# Patient Record
Sex: Female | Born: 1970 | Race: Black or African American | Hispanic: No | Marital: Single | Smoking: Never smoker
Health system: Southern US, Community
[De-identification: ages and names within clinical notes are randomized; demographics above are authoritative.]

## PROBLEM LIST (undated history)

## (undated) DIAGNOSIS — I10 Essential (primary) hypertension: Secondary | ICD-10-CM

---

## 1999-12-10 ENCOUNTER — Encounter: Payer: Self-pay | Admitting: Emergency Medicine

## 1999-12-10 ENCOUNTER — Emergency Department (HOSPITAL_COMMUNITY): Admission: EM | Admit: 1999-12-10 | Discharge: 1999-12-10 | Payer: Self-pay | Admitting: Emergency Medicine

## 2000-11-05 ENCOUNTER — Emergency Department (HOSPITAL_COMMUNITY): Admission: EM | Admit: 2000-11-05 | Discharge: 2000-11-05 | Payer: Self-pay | Admitting: Emergency Medicine

## 2000-11-05 ENCOUNTER — Encounter: Payer: Self-pay | Admitting: Emergency Medicine

## 2002-09-26 ENCOUNTER — Emergency Department (HOSPITAL_COMMUNITY): Admission: EM | Admit: 2002-09-26 | Discharge: 2002-09-26 | Payer: Self-pay | Admitting: Emergency Medicine

## 2004-03-08 ENCOUNTER — Ambulatory Visit: Payer: Self-pay | Admitting: Family Medicine

## 2004-07-04 ENCOUNTER — Ambulatory Visit: Payer: Self-pay | Admitting: *Deleted

## 2004-07-04 ENCOUNTER — Ambulatory Visit: Payer: Self-pay | Admitting: Family Medicine

## 2004-08-17 ENCOUNTER — Ambulatory Visit: Payer: Self-pay | Admitting: Family Medicine

## 2004-09-07 ENCOUNTER — Ambulatory Visit: Payer: Self-pay | Admitting: Family Medicine

## 2004-09-21 ENCOUNTER — Ambulatory Visit: Payer: Self-pay | Admitting: Family Medicine

## 2004-09-26 ENCOUNTER — Ambulatory Visit: Payer: Self-pay | Admitting: *Deleted

## 2004-09-26 ENCOUNTER — Ambulatory Visit (HOSPITAL_COMMUNITY): Admission: RE | Admit: 2004-09-26 | Discharge: 2004-09-26 | Payer: Self-pay | Admitting: *Deleted

## 2004-10-19 ENCOUNTER — Ambulatory Visit: Payer: Self-pay | Admitting: *Deleted

## 2004-11-02 ENCOUNTER — Ambulatory Visit: Payer: Self-pay | Admitting: Family Medicine

## 2004-11-16 ENCOUNTER — Ambulatory Visit: Payer: Self-pay | Admitting: Family Medicine

## 2004-11-30 ENCOUNTER — Ambulatory Visit (HOSPITAL_COMMUNITY): Admission: RE | Admit: 2004-11-30 | Discharge: 2004-11-30 | Payer: Self-pay | Admitting: *Deleted

## 2004-11-30 ENCOUNTER — Ambulatory Visit: Payer: Self-pay | Admitting: Family Medicine

## 2004-12-14 ENCOUNTER — Ambulatory Visit: Payer: Self-pay | Admitting: Family Medicine

## 2004-12-28 ENCOUNTER — Ambulatory Visit: Payer: Self-pay | Admitting: Family Medicine

## 2005-01-03 ENCOUNTER — Ambulatory Visit: Payer: Self-pay | Admitting: Obstetrics & Gynecology

## 2005-01-05 ENCOUNTER — Ambulatory Visit (HOSPITAL_COMMUNITY): Admission: RE | Admit: 2005-01-05 | Discharge: 2005-01-05 | Payer: Self-pay | Admitting: *Deleted

## 2005-01-11 ENCOUNTER — Ambulatory Visit: Payer: Self-pay | Admitting: Family Medicine

## 2005-01-18 ENCOUNTER — Ambulatory Visit: Payer: Self-pay | Admitting: Family Medicine

## 2005-01-22 ENCOUNTER — Ambulatory Visit: Payer: Self-pay | Admitting: Obstetrics & Gynecology

## 2005-01-22 ENCOUNTER — Ambulatory Visit (HOSPITAL_COMMUNITY): Admission: RE | Admit: 2005-01-22 | Discharge: 2005-01-22 | Payer: Self-pay | Admitting: *Deleted

## 2005-01-25 ENCOUNTER — Ambulatory Visit: Payer: Self-pay | Admitting: Family Medicine

## 2005-01-29 ENCOUNTER — Ambulatory Visit (HOSPITAL_COMMUNITY): Admission: RE | Admit: 2005-01-29 | Discharge: 2005-01-29 | Payer: Self-pay | Admitting: *Deleted

## 2005-01-29 ENCOUNTER — Ambulatory Visit: Payer: Self-pay | Admitting: Obstetrics & Gynecology

## 2005-01-31 ENCOUNTER — Ambulatory Visit: Payer: Self-pay | Admitting: Obstetrics & Gynecology

## 2005-02-05 ENCOUNTER — Ambulatory Visit: Payer: Self-pay | Admitting: Obstetrics and Gynecology

## 2005-02-05 ENCOUNTER — Ambulatory Visit (HOSPITAL_COMMUNITY): Admission: RE | Admit: 2005-02-05 | Discharge: 2005-02-05 | Payer: Self-pay | Admitting: *Deleted

## 2005-02-08 ENCOUNTER — Ambulatory Visit: Payer: Self-pay | Admitting: Family Medicine

## 2005-02-13 ENCOUNTER — Ambulatory Visit (HOSPITAL_COMMUNITY): Admission: RE | Admit: 2005-02-13 | Discharge: 2005-02-13 | Payer: Self-pay | Admitting: *Deleted

## 2005-02-13 ENCOUNTER — Ambulatory Visit: Payer: Self-pay | Admitting: *Deleted

## 2005-02-15 ENCOUNTER — Ambulatory Visit: Payer: Self-pay | Admitting: Family Medicine

## 2005-02-17 ENCOUNTER — Inpatient Hospital Stay (HOSPITAL_COMMUNITY): Admission: AD | Admit: 2005-02-17 | Discharge: 2005-02-25 | Payer: Self-pay

## 2005-02-17 ENCOUNTER — Ambulatory Visit: Payer: Self-pay | Admitting: Obstetrics and Gynecology

## 2005-02-21 ENCOUNTER — Encounter (INDEPENDENT_AMBULATORY_CARE_PROVIDER_SITE_OTHER): Payer: Self-pay | Admitting: Specialist

## 2005-02-28 ENCOUNTER — Ambulatory Visit: Payer: Self-pay | Admitting: *Deleted

## 2005-04-27 ENCOUNTER — Inpatient Hospital Stay (HOSPITAL_COMMUNITY): Admission: AD | Admit: 2005-04-27 | Discharge: 2005-04-27 | Payer: Self-pay | Admitting: Obstetrics and Gynecology

## 2005-04-27 ENCOUNTER — Ambulatory Visit: Payer: Self-pay | Admitting: Obstetrics and Gynecology

## 2005-05-04 ENCOUNTER — Emergency Department (HOSPITAL_COMMUNITY): Admission: EM | Admit: 2005-05-04 | Discharge: 2005-05-04 | Payer: Self-pay | Admitting: Emergency Medicine

## 2005-05-12 ENCOUNTER — Emergency Department (HOSPITAL_COMMUNITY): Admission: EM | Admit: 2005-05-12 | Discharge: 2005-05-12 | Payer: Self-pay | Admitting: Emergency Medicine

## 2005-12-12 ENCOUNTER — Emergency Department (HOSPITAL_COMMUNITY): Admission: EM | Admit: 2005-12-12 | Discharge: 2005-12-12 | Payer: Self-pay | Admitting: Emergency Medicine

## 2006-04-20 IMAGING — US US FETAL BPP W/O NONSTRESS
1 series · 14 of 28 positions shown · non-contrast
Comparison: none

CLINICAL DATA: 35 week 2 day assigned gestational age.  Gestational diabetes and maternal hypertension.  Evaluate biophysical profile.

[Series 1: us fetal bpp w/o nonstress · 0.39mm/px · 14 of 33 slices shown]
[im 2/33]
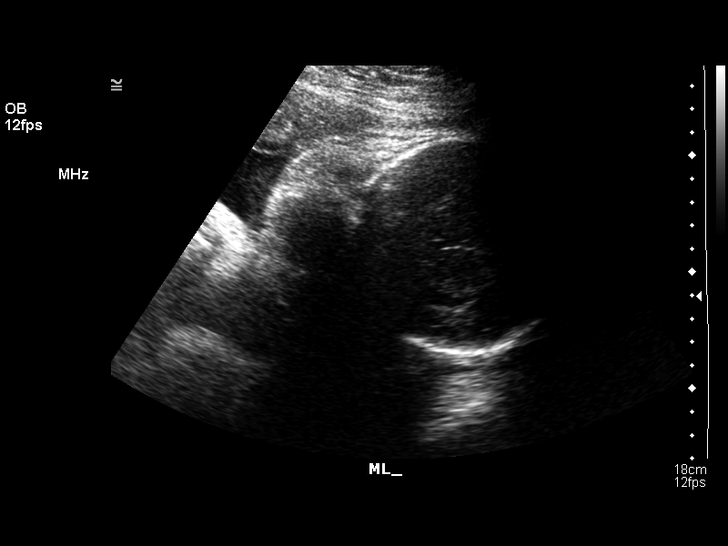
[im 4/33]
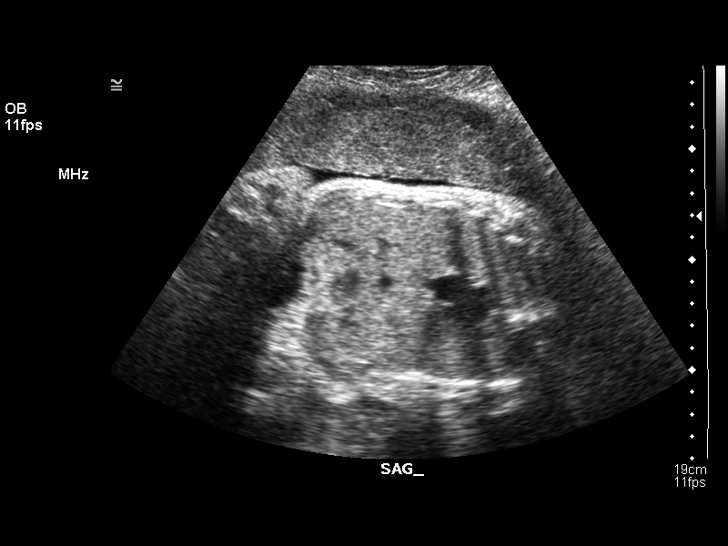
[im 6/33]
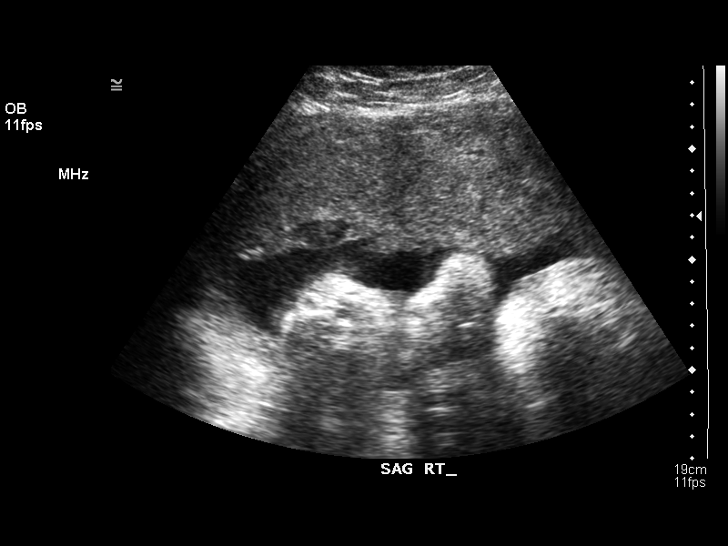
[im 9/33]
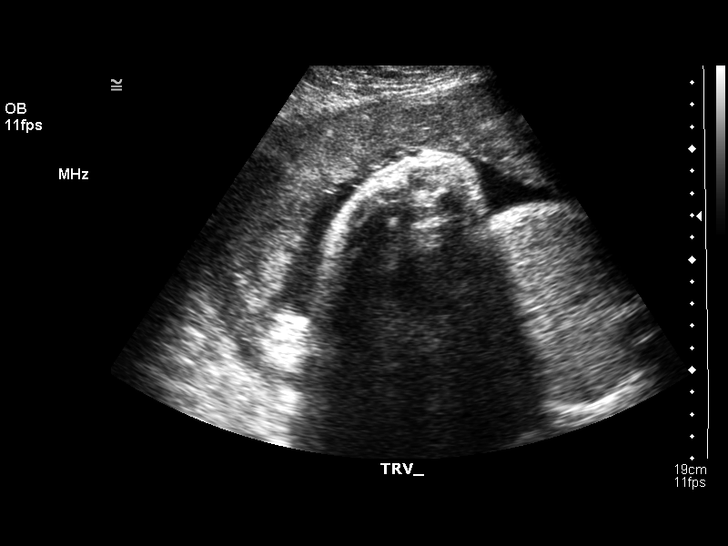
[im 11/33]
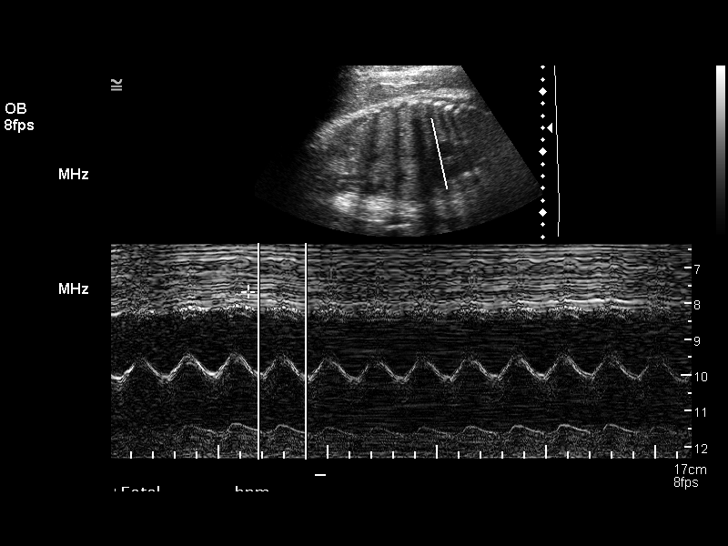
[im 14/33]
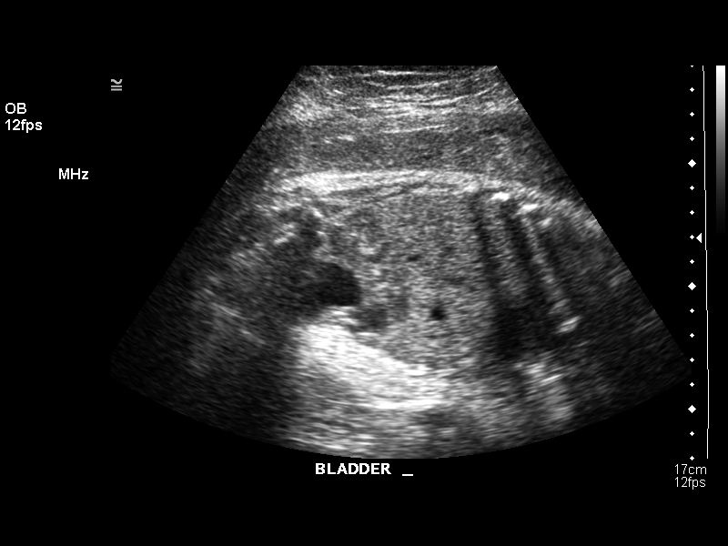
[im 16/33]
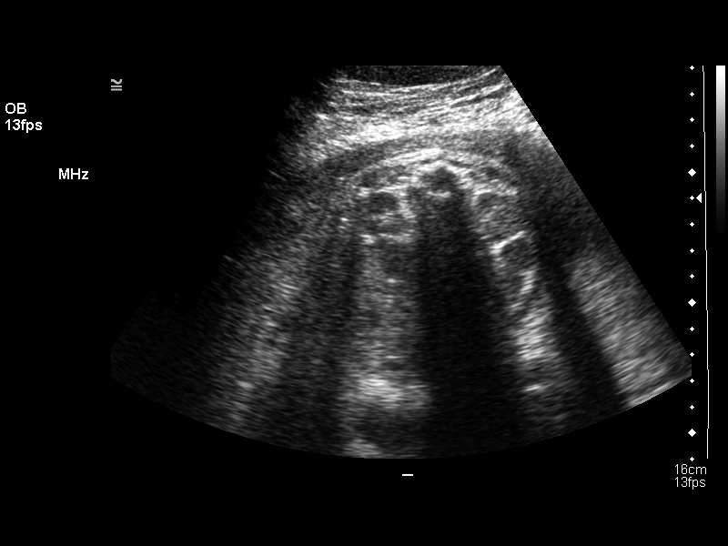
[im 18/33]
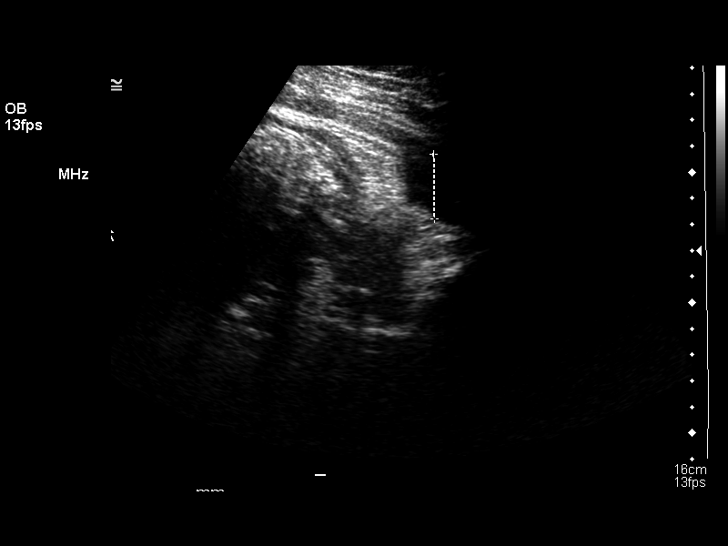
[im 21/33]
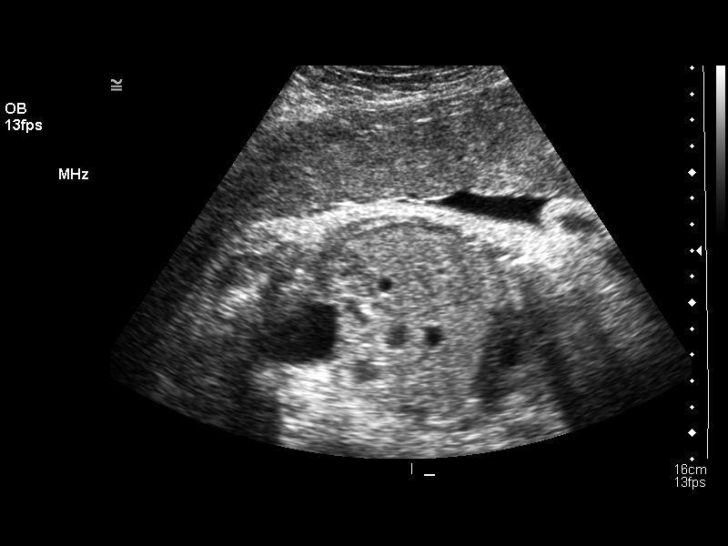
[im 23/33]
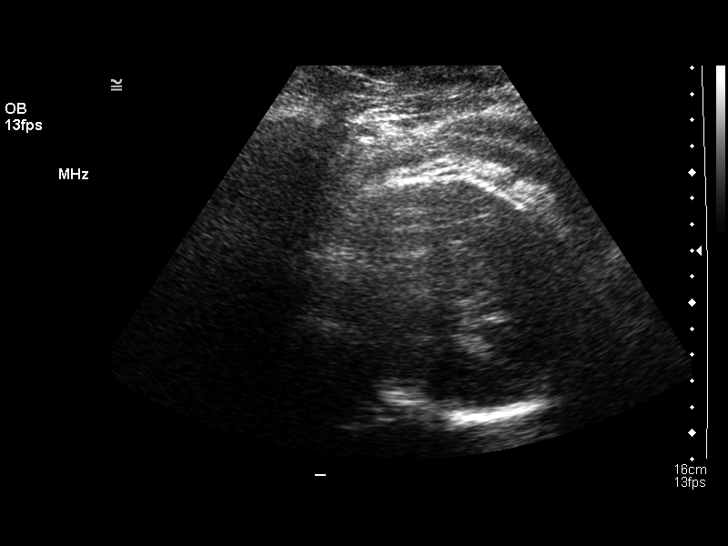
[im 25/33]
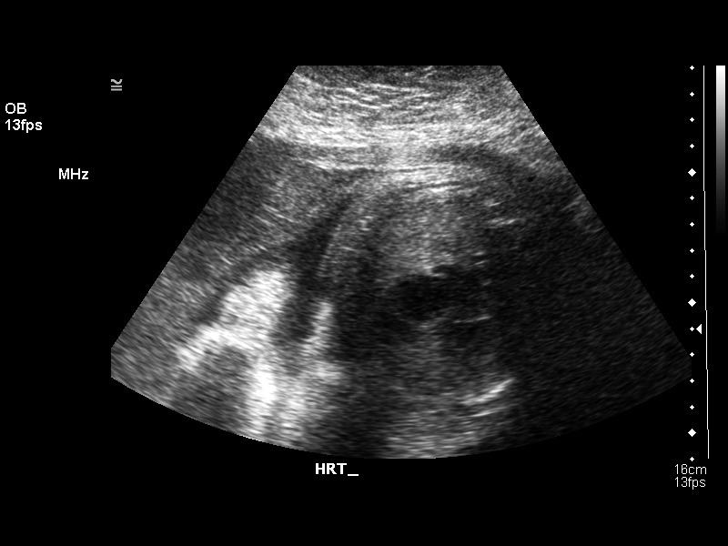
[im 28/33]
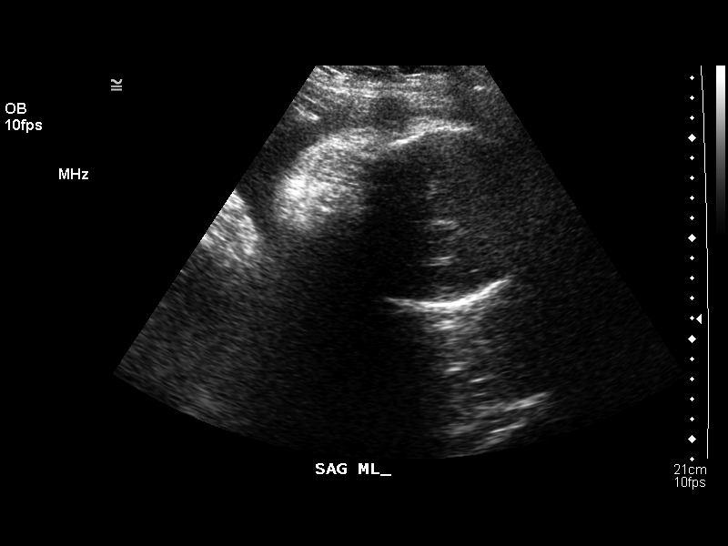
[im 30/33]
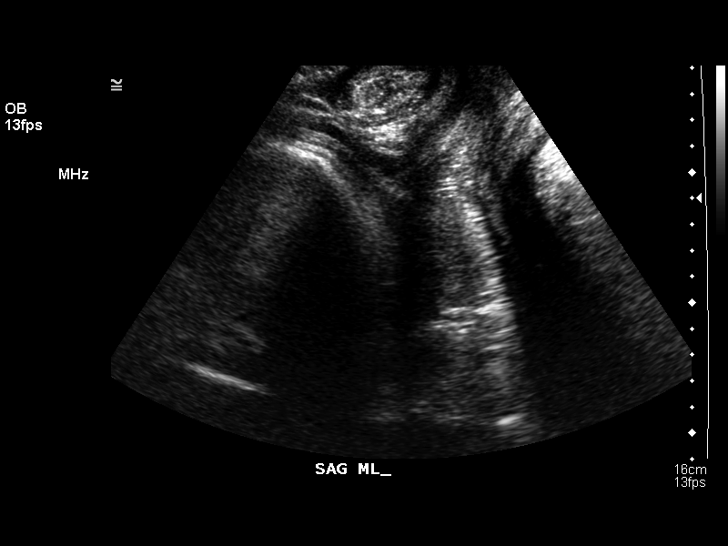
[im 33/33]
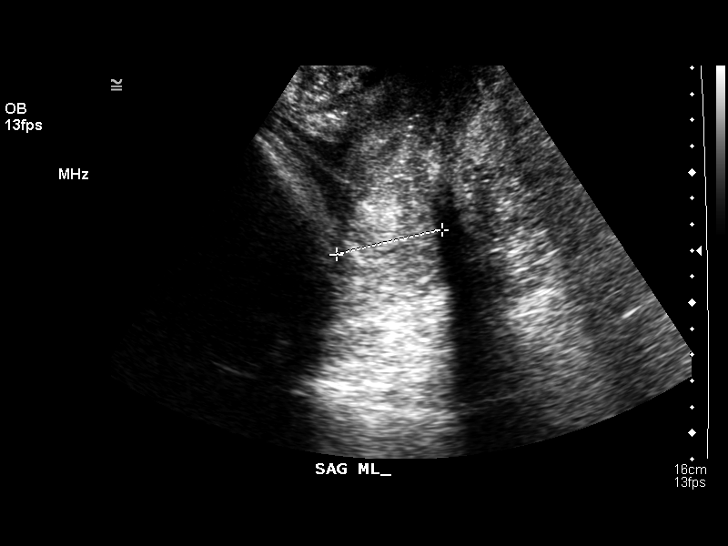

[14 of 28 positions shown; findings below may reference images not displayed]

BIOPHYSICAL PROFILE

 Number of Fetuses:  1
 Heart rate:  139
 Movement:  Yes
 Breathing:  Yes
 Presentation:  Cephalic
 Placental Location:  Posterior
 Grade:  II
 Previa:  No
 Amniotic Fluid (Subjective):  Normal
 Amniotic Fluid (Objective):  17.0 cm AFI (5th -95th%ile = 7.9 ? 24.9 cm for 35 wks)

 Fetal measurements and complete anatomic evaluation were not requested.  The following fetal anatomy was visualized on this exam:  Stomach, kidneys, bladder, diaphragm and female genitalia.  

 BPP SCORING
 Movements:  2  Time:  20 minutes
 Breathing:  2
 Tone:  2
 Amniotic Fluid:  2
 Total Score:  8

 MATERNAL UTERINE AND ADNEXAL FINDINGS
 Cervix:  4.2 cm Translabially
IMPRESSION: Single living intrauterine fetus in cephalic presentation.  Biophysical profile score [DATE].

## 2006-05-04 IMAGING — US US FETAL BPP W/O NONSTRESS
1 series · 18 of 22 positions shown · non-contrast
Comparison: none

CLINICAL DATA: Gestational diabetes with hypertension.  Assess fetal well-being.

[Series 1: us fetal bpp w/o nonstress · non-contrast · 18 of 22 slices shown]
[im 1/22]
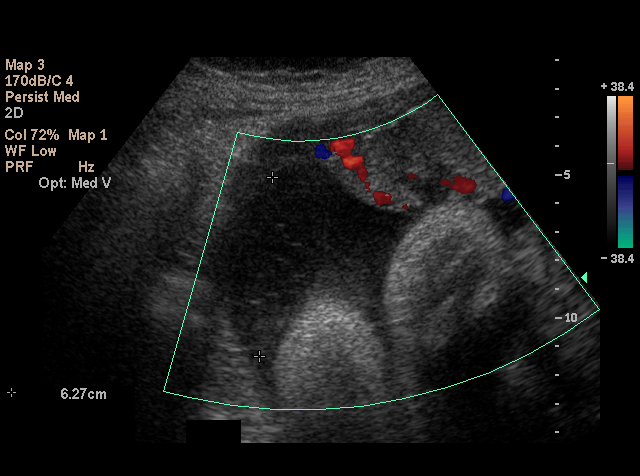
[im 2/22]
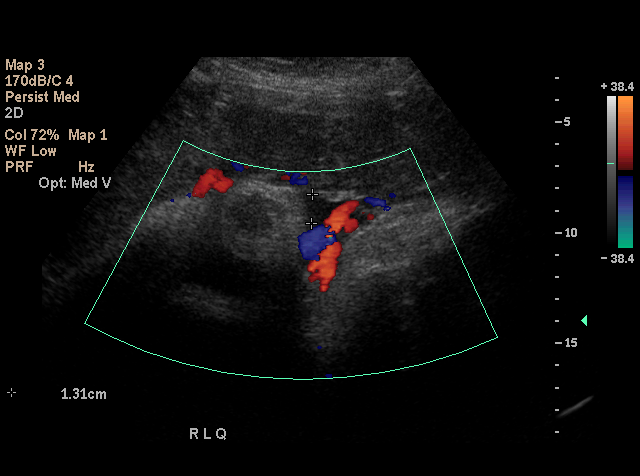
[im 4/22]
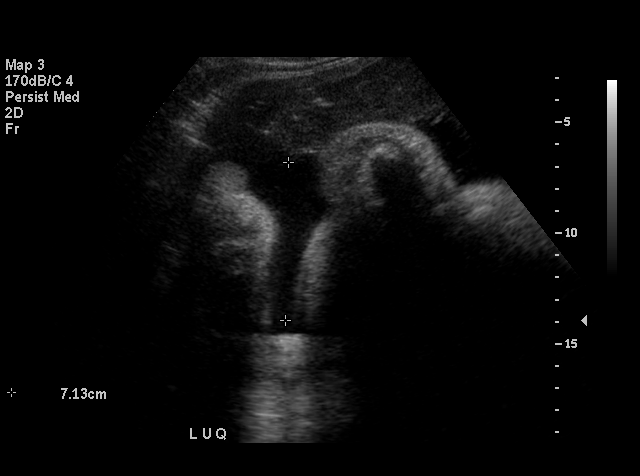
[im 5/22]
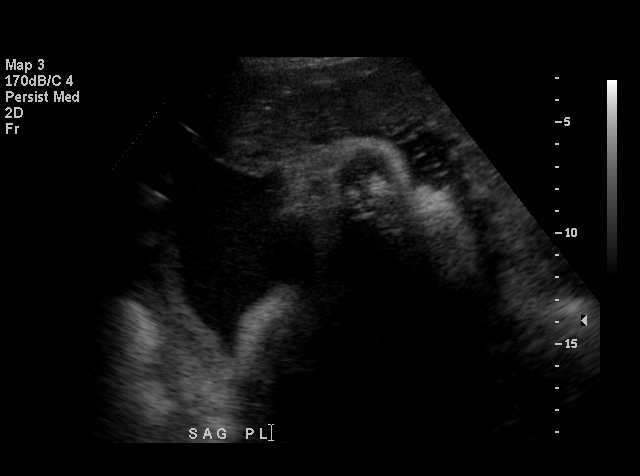
[im 6/22]
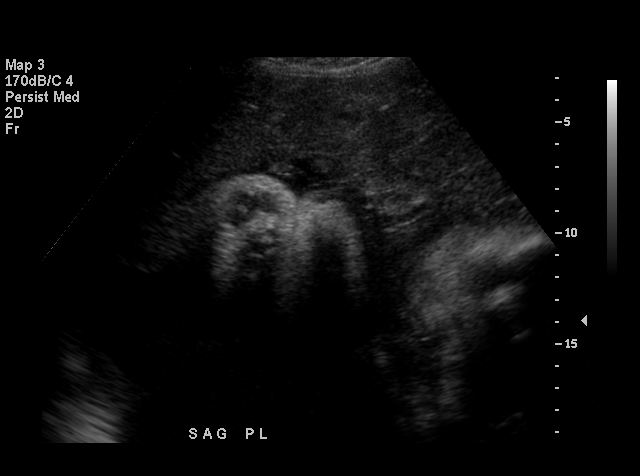
[im 7/22]
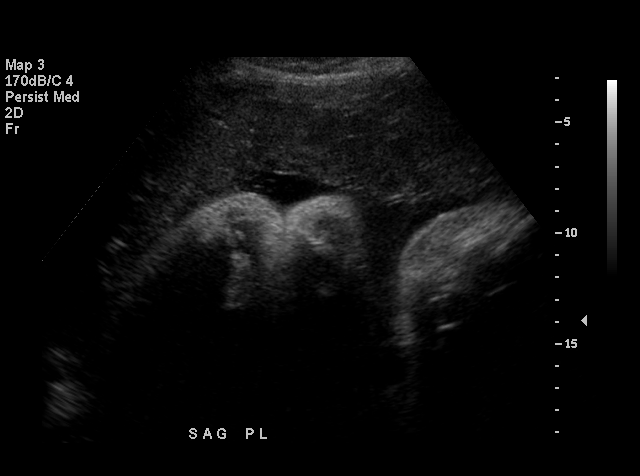
[im 8/22]
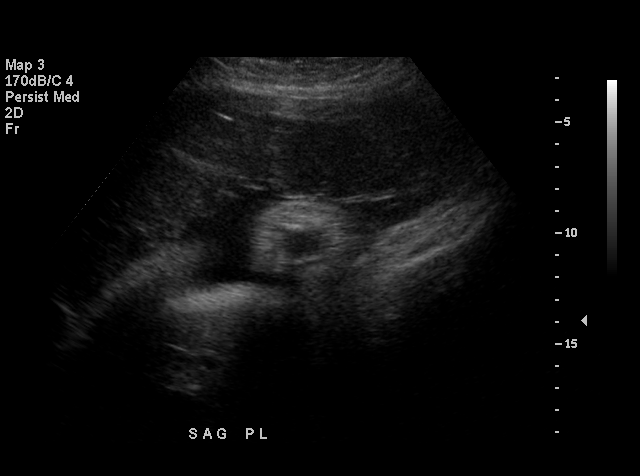
[im 10/22]
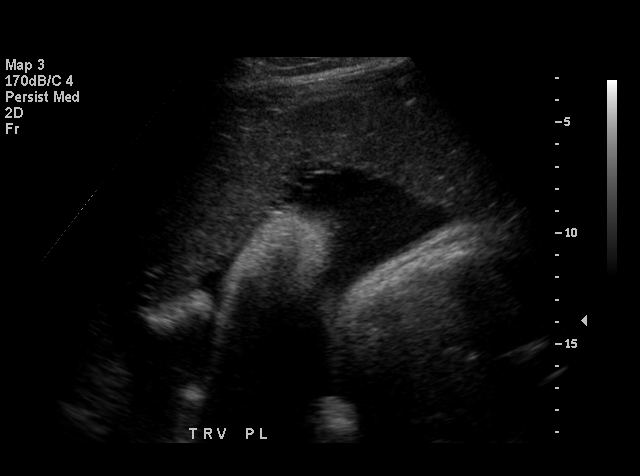
[im 11/22]
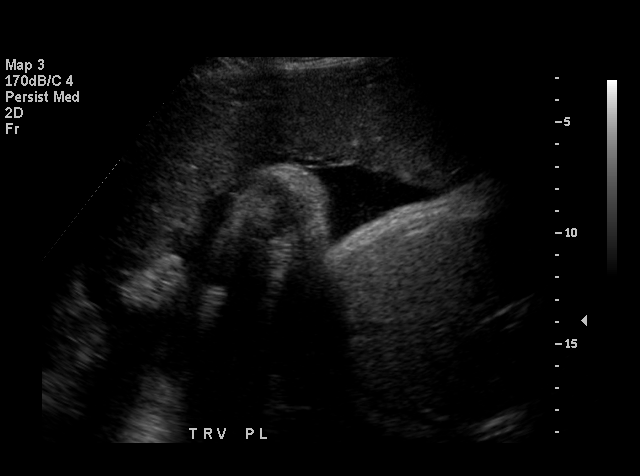
[im 12/22]
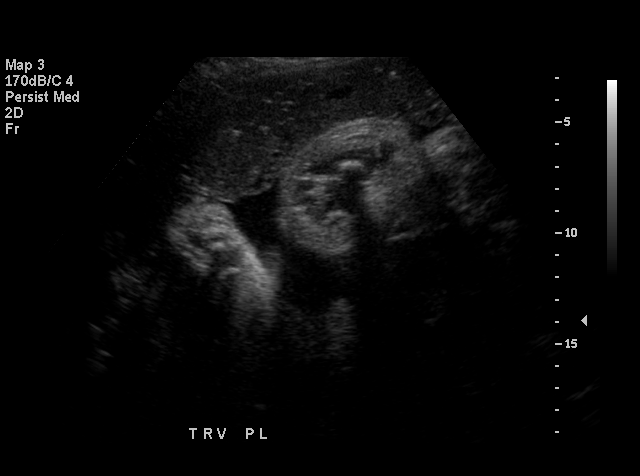
[im 13/22]
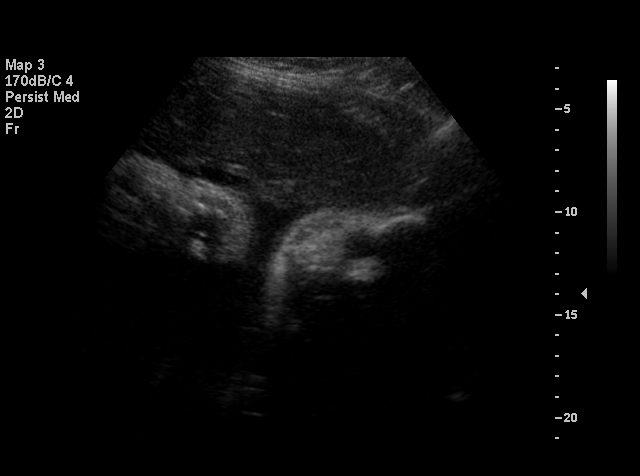
[im 15/22]
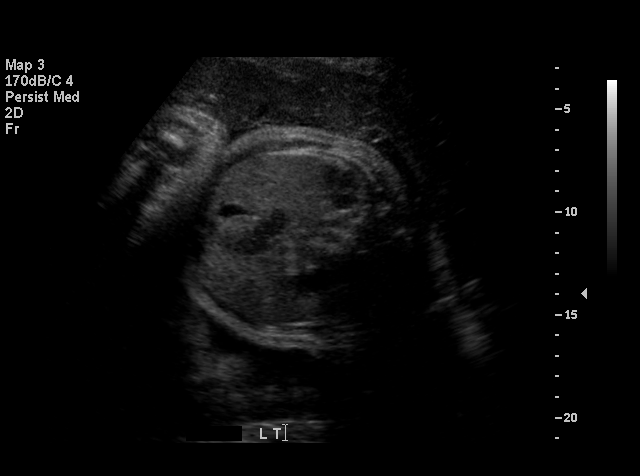
[im 16/22]
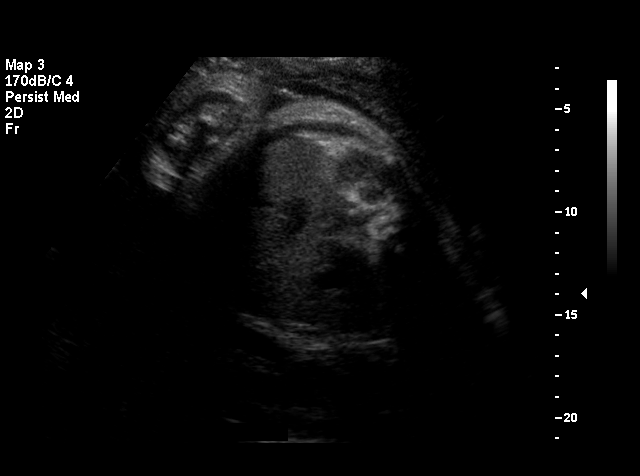
[im 17/22]
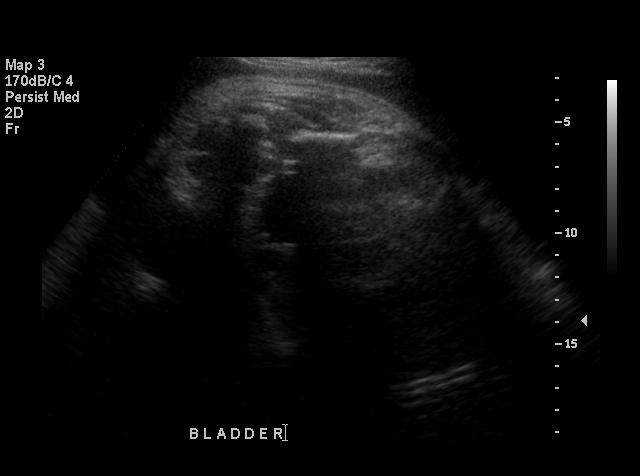
[im 18/22]
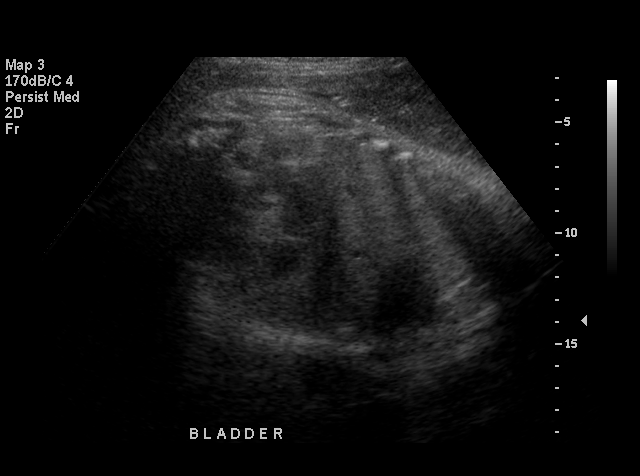
[im 19/22]
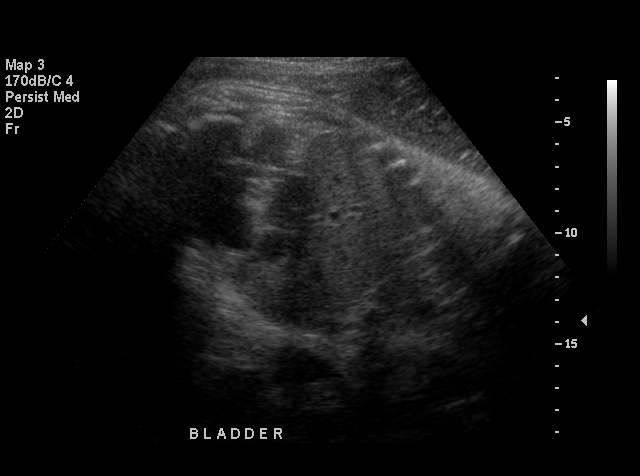
[im 21/22]
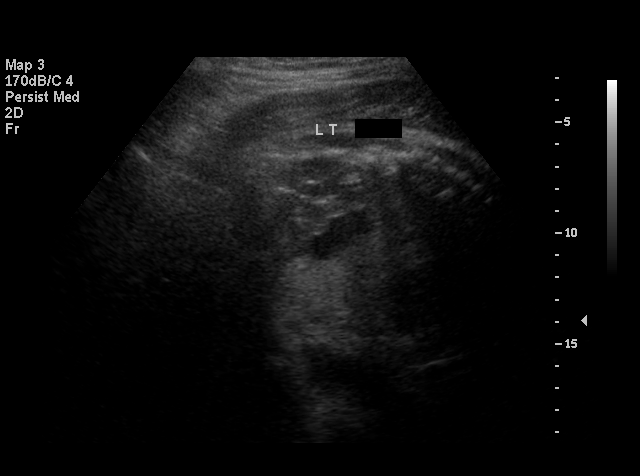
[im 22/22]
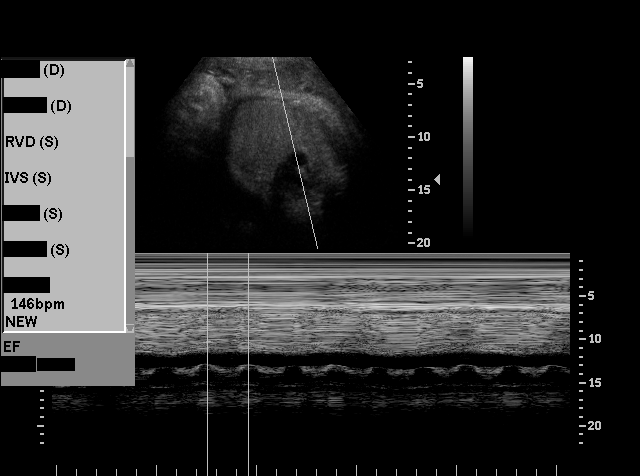

[18 of 22 positions shown; findings below may reference images not displayed]

BIOPHYSICAL PROFILE

Number of Fetuses:  1
Heart rate:  146
Movement:  Yes
Breathing:  Yes
Presentation:  Cephalic
Placental Location:  Anterior
Grade:  II
Previa:  No
Amniotic Fluid (Subjective):  Normal
Amniotic Fluid (Objective):  14.7 cm AFI (5th -95th%ile = 7.5 ? 24.4 cm for 37 wks)

Fetal measurements and complete anatomic evaluation were not requested.  The following fetal anatomy was visualized on this exam:  Stomach, kidneys and bladder.

BPP SCORING
Movements: 2  Time:  25 minutes
Breathing:  2
Tone:  2
Amniotic Fluid:  2
Total Score:  8

MATERNAL UTERINE AND ADNEXAL FINDINGS
Cervix:  Not evaluated.
IMPRESSION: 1.  Biophysical profile score [DATE].  
2.  Subjectively and quantitatively normal amniotic fluid volume.  
3.  No late developing fetal anatomic abnormalities are identified associated with the stomach, kidneys or bladder.  A four chamber heart view and lateral ventricles could not be seen with confidence due to positioning on today?s exam.

## 2006-07-26 ENCOUNTER — Emergency Department (HOSPITAL_COMMUNITY): Admission: EM | Admit: 2006-07-26 | Discharge: 2006-07-26 | Payer: Self-pay | Admitting: Emergency Medicine

## 2006-09-17 ENCOUNTER — Emergency Department (HOSPITAL_COMMUNITY): Admission: EM | Admit: 2006-09-17 | Discharge: 2006-09-17 | Payer: Self-pay | Admitting: Emergency Medicine

## 2007-07-25 ENCOUNTER — Emergency Department (HOSPITAL_COMMUNITY): Admission: EM | Admit: 2007-07-25 | Discharge: 2007-07-25 | Payer: Self-pay | Admitting: Emergency Medicine

## 2010-07-02 ENCOUNTER — Encounter: Payer: Self-pay | Admitting: *Deleted

## 2010-10-27 NOTE — Op Note (Signed)
NAME:  Susan Hays, Susan Hays NO.:  [id number]   MEDICAL RECORD NO.:  [id number]          PATIENT TYPE:  INP   LOCATION:                                FACILITY:  [facility]   PHYSICIAN:  Conni Elliot, M.D.DATE OF BIRTH:  03/02/1971   DATE OF PROCEDURE:  DATE OF DISCHARGE:                                 OPERATIVE REPORT   PREOPERATIVE DIAGNOSES:  1.  A 39-week intrauterine pregnancy.  2.  Multiple moderate to severe variables.  3.  Gestational diabetes.  4.  Chronic hypertension with possible superimposed pre-eclampsia.   POSTOPERATIVE DIAGNOSES:  1.  A 39-week intrauterine pregnancy.  2.  Multiple moderate to severe variables.  3.  Gestational diabetes.  4.  Chronic hypertension with possible superimposed pre-eclampsia.   PROCEDURE:  1.  Low transverse cesarean section via Pfannenstiel.  2.  Pomeroy bilateral tubal ligation.   SURGEON:  Conni Elliot, M.D.   ASSISTANTKaroline Caldwell B. Merlene Morse, M.D.   ANESTHESIA:  Epidural.   COMPLICATIONS:  None.   ESTIMATED BLOOD LOSS:  850 mL.   FLUIDS:  1800 mL lactated Ringer's as well as a dose of cefotetan.   URINE OUTPUT:  325 mL clear urine at the end of the procedure.   INDICATION:  The patient is a 40 year old G3, P1-0-1-1, who was being  induced at 39 weeks secondary to gestational diabetes and chronic  hypertension who was having moderate to severe variables and her maximum  dilatation was 4-5 cm.   FINDINGS:  Female infant in OP presentation.  Nuchal cord.  Adhesions  consistent with previous cesarean section as well as scar on the skin.  Pediatrics present at delivery.  Apgars 8 and 9.  Weight 8 pounds 14 ounces.  Normal uterus, tubes, and ovaries.   DESCRIPTION OF PROCEDURE:  The patient was taken to the operating room where  epidural anesthesia was found to be adequate.  She was then prepped and  draped in normal sterile fashion in the dorsal supine position with a  leftward tilt.  A Pfannenstiel skin  incision was then made with a scalpel  and carried through to the underlying layer of fascia with the scalpel.  The  fascia was incised in the midline and incision extended laterally with the  Mayo scissors.  The superior aspect of the fascial incision was then grasped  with Kocher clamp, elevated, and the underlying rectus muscles dissected off  bluntly.   Attention was then turned to the inferior aspect of the incision which, in a  similar fashion, was grasped, tented up with the Kocher clamps, and the  rectus muscles dissected off bluntly.  The rectus muscle was then separated  in the midline and the peritoneum identified, tented up, and entered sharply  with Metzenbaum scissors.  The peritoneal incision was then extended  superiorly and inferiorly with good visualization.  Bladder blade was then  inserted and the vesicouterine peritoneum identified, grasped with pickups,  and entered sharply with Metzenbaum scissors.  The incision was then  extended laterally with the bandage and the bladder flap created digitally.  The  bladder blade was then reinserted and the lower uterine segment incised  in a transverse fashion with the scalpel.  The uterine incision was then  extended laterally with the bandage scissors.  The bladder blade was removed  and the infant's head delivered atraumatically.  The nose and mouth were  suctioned with a bulb syringe, the cord clamped and cut, and the infant was  handed off to the awaiting pediatrician.  Cord gases were sent and were  7.24.  Placenta was then removed manually, the uterus exteriorized and  cleared of all clots and debris.  The patient's left fallopian tube was  identified and grasped with Babcock clamp.  The tube was then followed out  to the fimbria.  The Babcock clamp was then used to grasp the tube  approximately 4 cm from the cornual region.  A 3-cm segment of tube was then  ligated with two free ties of chromic and excised.  Hemostasis was  noted and  the tube was noted.  The right fallopian tube was then ligated and a 3-cm  segment excised in a similar fashion.  Excellent hemostasis was noted.  The  uterus was then returned to the abdomen.  The uterine incision was repaired  in a running locking fashion and a second layer with the same suture was  used to obtain excellent hemostasis.  The gutters were cleared of all clots  and the peritoneum closed.  The fascia was reapproximated and closed in a  running fashion.  The fat was reapproximated with suture and the skin was  closed with staples.  The patient tolerated the procedure well.  Sponge,  lap, and needle counts were correct x2.  Two grams of cefotetan were given.  The patient was taken to the recovery room in stable condition.     ______________________________  August Saucer Merlene Morse, MD    ______________________________  Conni Elliot, M.D.    [facility]  D:  02/21/2005  T:  02/21/2005  Job:  [id number]
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
## 2011-03-02 LAB — D-DIMER, QUANTITATIVE: D-Dimer, Quant: 0.29

## 2011-03-02 LAB — DIFFERENTIAL
Basophils Absolute: 0
Basophils Relative: 0
Eosinophils Absolute: 0.1
Lymphocytes Relative: 22
Neutro Abs: 6
Neutrophils Relative %: 69

## 2011-03-02 LAB — BASIC METABOLIC PANEL
Chloride: 104
GFR calc Af Amer: >60
Glucose, Bld: 95
Potassium: 4.1
Sodium: 138

## 2011-03-02 LAB — CBC
MCHC: 32.5
MCV: 75 — ABNORMAL LOW
Platelets: 330
RBC: 4.25
WBC: 8.8

## 2013-04-08 ENCOUNTER — Emergency Department (HOSPITAL_COMMUNITY)
Admission: EM | Admit: 2013-04-08 | Discharge: 2013-04-08 | Disposition: A | Discharge status: Home / Self Care | Payer: Self-pay | Attending: Emergency Medicine | Admitting: Emergency Medicine

## 2013-04-08 ENCOUNTER — Encounter (HOSPITAL_COMMUNITY): Payer: Self-pay | Admitting: Emergency Medicine

## 2013-04-08 DIAGNOSIS — H6983 Other specified disorders of Eustachian tube, bilateral: Secondary | ICD-10-CM

## 2013-04-08 DIAGNOSIS — J069 Acute upper respiratory infection, unspecified: Secondary | ICD-10-CM | POA: Insufficient documentation

## 2013-04-08 DIAGNOSIS — I10 Essential (primary) hypertension: Secondary | ICD-10-CM | POA: Insufficient documentation

## 2013-04-08 DIAGNOSIS — R011 Cardiac murmur, unspecified: Secondary | ICD-10-CM | POA: Insufficient documentation

## 2013-04-08 DIAGNOSIS — H6990 Unspecified Eustachian tube disorder, unspecified ear: Secondary | ICD-10-CM | POA: Insufficient documentation

## 2013-04-08 DIAGNOSIS — H6980 Other specified disorders of Eustachian tube, unspecified ear: Secondary | ICD-10-CM | POA: Insufficient documentation

## 2013-04-08 MED ORDER — HYDROCODONE-ACETAMINOPHEN 5-325 MG PO TABS: 1.0000 | ORAL_TABLET | ORAL | Status: DC | PRN

## 2013-04-08 NOTE — ED Notes (Signed)
Patient states she has had a cold x a few days.   Patient states productive cough that hurts when she coughs.   Patient complains of "my ears pop when I swallow and it hurts".   Patient states her ears only hurt when they pop.

## 2013-04-08 NOTE — ED Notes (Signed)
Pt c/o URI sx with painful cough; pt sts right ear pain

## 2013-04-08 NOTE — ED Provider Notes (Signed)
CSN: [id number]     Arrival date & time 04/08/13  1221 History  This chart was scribed for non-physician practitioner Arthor Captain, PA-C working with Shelda Jakes, MD by Valera Castle, ED scribe. This patient was seen in room TR08C/TR08C and the patient's care was started at 1:10 PM.    Chief Complaint  Patient presents with  . URI  . Cough  . Otalgia   The history is provided by the patient. No language interpreter was used.   HPI Comments: Susan Hays is a 42 y.o. female who presents to the Emergency Department complaining of a URI with sudden, moderate, constant, bilateral ear pain, without drainage, onset 1 week ago, and unproductive, painful cough, onset 3-4 days ago. She reports that her ears keep popping, but that her cough is subsiding. She reports taking Robitussin DM, with some relief. She denies sore throat, and any other associated symptoms. She denies any medical history. 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   PCP - No primary provider on file.   History reviewed. No pertinent past medical history. History reviewed. No pertinent past surgical history. History reviewed. No pertinent family history. History  Substance Use Topics  . Smoking status: Never Smoker   . Smokeless tobacco: Not on file  . Alcohol Use: No   OB History   Grav Para Term Preterm Abortions TAB SAB Ect Mult Living                 Review of Systems  HENT: Positive for ear pain (right). Negative for sore throat.   Respiratory: Positive for cough (unproductive).   All other systems reviewed and are negative.    Allergies  Review of patient's allergies indicates no known allergies.  Home Medications  No current outpatient prescriptions on file.  Triage Vitals: BP 197/87  Pulse 96  Temp(Src) 99.1 F (37.3 C) (Oral)  Resp 20  Ht 5\' 5"  (1.651 m)  Wt 243 lb (110.224 kg)  BMI 40.44 kg/m2  SpO2 97%  Physical Exam  Nursing note and vitals reviewed. Constitutional: She is  oriented to person, place, and time. She appears well-developed and well-nourished. No distress.  HENT:  Head: Normocephalic and atraumatic.  Right Ear: External ear normal.  Left Ear: External ear normal.  Mouth/Throat: Oropharynx is clear and moist.  Eyes: EOM are normal.  Neck: Neck supple. No tracheal deviation present.  Cardiovascular: Normal rate and regular rhythm.   Murmur heard. Systolic flow murmer heard best at second left ICSMCL.  Pulmonary/Chest: Effort normal and breath sounds normal. No respiratory distress. She has no wheezes. She has no rales.  Musculoskeletal: Normal range of motion.  Neurological: She is alert and oriented to person, place, and time.  Skin: Skin is warm and dry.  Psychiatric: She has a normal mood and affect. Her behavior is normal.    ED Course  Procedures (including critical care time)  DIAGNOSTIC STUDIES: Oxygen Saturation is 97% on room air, normal by my interpretation.    COORDINATION OF CARE: 1:15 PM-Discussed treatment plan with pt at bedside and pt agreed to plan.   Labs Review Labs Reviewed - No data to display Imaging Review No results found.  EKG Interpretation   None      No orders of the defined types were placed in this encounter.    MDM  No diagnosis found. Patients symptoms are consistent with URI, likely viral etiology. Suspect eustachian tube dysfunction.Discussed that antibiotics are not indicated for viral infections.  Pt will be discharged with symptomatic treatment.  Verbalizes understanding and is agreeable with plan. Pt is hemodynamically stable & in NAD prior to dc.     I personally performed the services described in this documentation, which was scribed in my presence. The recorded information has been reviewed and is accurate.     Arthor Captain, PA-C 04/08/13 1340

## 2013-04-10 NOTE — ED Provider Notes (Signed)
Medical screening examination/treatment/procedure(s) were performed by non-physician practitioner and as supervising physician I was immediately available for consultation/collaboration.  EKG Interpretation   None         Maxime Beckner W. Charolette Bultman, MD 04/10/13 0850 

## 2015-05-06 ENCOUNTER — Encounter (HOSPITAL_COMMUNITY): Payer: Self-pay | Admitting: Vascular Surgery

## 2015-05-06 ENCOUNTER — Emergency Department (HOSPITAL_COMMUNITY)
Admission: EM | Admit: 2015-05-06 | Discharge: 2015-05-06 | Disposition: A | Discharge status: Home / Self Care | Payer: Self-pay | Attending: Emergency Medicine | Admitting: Emergency Medicine

## 2015-05-06 DIAGNOSIS — I10 Essential (primary) hypertension: Secondary | ICD-10-CM | POA: Insufficient documentation

## 2015-05-06 DIAGNOSIS — Y9289 Other specified places as the place of occurrence of the external cause: Secondary | ICD-10-CM | POA: Insufficient documentation

## 2015-05-06 DIAGNOSIS — T148XXA Other injury of unspecified body region, initial encounter: Secondary | ICD-10-CM

## 2015-05-06 DIAGNOSIS — Y998 Other external cause status: Secondary | ICD-10-CM | POA: Insufficient documentation

## 2015-05-06 DIAGNOSIS — S7011XA Contusion of right thigh, initial encounter: Secondary | ICD-10-CM | POA: Insufficient documentation

## 2015-05-06 DIAGNOSIS — Y9389 Activity, other specified: Secondary | ICD-10-CM | POA: Insufficient documentation

## 2015-05-06 DIAGNOSIS — W1839XA Other fall on same level, initial encounter: Secondary | ICD-10-CM | POA: Insufficient documentation

## 2015-05-06 DIAGNOSIS — W19XXXA Unspecified fall, initial encounter: Secondary | ICD-10-CM

## 2015-05-06 MED ORDER — CHLORTHALIDONE 100 MG PO TABS: 25.0000 mg | ORAL_TABLET | Freq: Every day | ORAL | Status: DC

## 2015-05-06 NOTE — ED Notes (Signed)
Pt reports to the ED for eval of pain following a fall. Pt reports she fell getting out of bed left inner thigh where she struck it on the floor. Pt denies any neck pain, back pain, or other extremity pain. Pt also denies any head injury or LOC. Pt ambulatory. She is A&Ox4, resp e/u, and skin warm and dry.

## 2015-05-06 NOTE — Discharge Instructions (Signed)
Please follow-up with [facility] within the next 3-5 days for reevaluation and chemistry panel. If any new or worsening signs or symptoms present please return immediately for further evaluation and management.

## 2015-05-06 NOTE — ED Provider Notes (Signed)
CSN: [id number]     Arrival date & time 05/06/15  1557 History   First MD Initiated Contact with Patient 05/06/15 1721     Chief Complaint  Patient presents with  . Fall   HPI   44 year old female presents today 5 days status post fall. Patient reports that she was getting out of bed when she tripped and fell landed on the inside of her right upper thigh. She notes at that time she had pain to the inner thigh with development of a significant bruise over the subsequent days. Patient reports she was using heat, ice as needed for discomfort. She denies any other injuries, reports that she was able to ambulate without any difficulty. She denies any pain to the knee and ankle or hip. Patient denies any lotion swelling or edema, chest pain, headache, shortness of breath, abdominal pain, fever, chills, changes in the color clarity or characteristics of her urine or any other concerning signs or symptoms.   History reviewed. No pertinent past medical history. Past Surgical History  Procedure Laterality Date  . Cesarean section     No family history on file. Social History  Substance Use Topics  . Smoking status: Never Smoker   . Smokeless tobacco: None  . Alcohol Use: No   OB History    No data available     Review of Systems  All other systems reviewed and are negative.  Allergies  Review of patient's allergies indicates no known allergies.  Home Medications   Prior to Admission medications   Medication Sig Start Date End Date Taking? Authorizing Provider  acetaminophen (TYLENOL) 500 MG tablet Take 1,000 mg by mouth every 6 (six) hours as needed for moderate pain.   Yes Historical Provider, MD  chlorthalidone (HYGROTEN) 100 MG tablet Take 0.5 tablets (50 mg total) by mouth daily. 05/06/15   Eyvonne MechanicJeffrey Sakia Schrimpf, PA-C  HYDROcodone-acetaminophen (NORCO) 5-325 MG per tablet Take 1 tablet by mouth every 4 (four) hours as needed for pain. 04/08/13   Abigail Harris, PA-C   BP 181/71 mmHg   Pulse 93  Temp(Src) 97.8 F (36.6 C) (Oral)  Resp 18  SpO2 99%  LMP 04/28/2015 (Approximate)   Physical Exam  Constitutional: She is oriented to person, place, and time. She appears well-developed and well-nourished.  HENT:  Head: Normocephalic and atraumatic.  Eyes: Conjunctivae are normal. Pupils are equal, round, and reactive to light. Right eye exhibits no discharge. Left eye exhibits no discharge. No scleral icterus.  Neck: Normal range of motion. No JVD present. No tracheal deviation present.  Cardiovascular: Normal rate, regular rhythm, normal heart sounds and intact distal pulses.  Exam reveals no gallop and no friction rub.   No murmur heard. Pulmonary/Chest: Effort normal. No stridor.  Abdominal: Soft. She exhibits no distension. There is no tenderness.  Musculoskeletal:  Large area of bruising to the right medial thigh and tenderness to palpation. Knee nontender full active range of motion distal extremity nontender no lower extremity swelling or edema. Patient is able to ambulate without difficulty  Neurological: She is alert and oriented to person, place, and time. She has normal strength. No cranial nerve deficit or sensory deficit. Coordination normal. GCS eye subscore is 4. GCS verbal subscore is 5. GCS motor subscore is 6.  Psychiatric: She has a normal mood and affect. Her behavior is normal. Judgment and thought content normal.  Nursing note and vitals reviewed.     ED Course  Procedures (including critical care time) Labs Review Labs  Reviewed - No data to display  Imaging Review No results found. I have personally reviewed and evaluated these images and lab results as part of my medical decision-making.   EKG Interpretation None      MDM   Final diagnoses:  Fall, initial encounter  Hematoma  Essential hypertension    Labs:  Imaging:  Consults:  Therapeutics:  Discharge Meds:   Assessment/Plan: Patient presents status post fall. Patient had  a fall 5 days ago with a large hematoma to the inside of her leg. She is able to ambulate without difficulty very low suspicion for any bony abnormality. Patient has no lower extremity swelling or edema there would indicate occlusion, this is likely superficial bruising. No signs of compartment syndrome. Patient is requesting a work note as she does not feel that going to work would be beneficial for her recovery. He was found to be significantly hypertensive here in the ED, upon my initial evaluation she was extremely anxious, laughing, smiling, and was not able to make eye contact. She appeared very shy and bashful, which resulted in immediate elevation in her blood pressure. After I left the room, her blood pressure returned within normal limits. Patient reports that she often has elevated blood pressure in the presence of medical professionals. Patient denies any signs or symptoms of end organ damage, low suspicion for any. Due to patient's elevated blood pressure I will start her on low-dose diuretic, and give her follow-up information for primary care. I informed her that is necessary for her to follow-up first thing Monday morning for laboratory evaluation, and medical management of her hypertension. I also counseled her on lifestyle modification including diet, exercise. Patient was given strict return precautions, she verbalizes understanding and agreement today's plan had no further questions or concerns at the time of discharge         Eyvonne Mechanic, PA-C 05/07/15 0105  Nelva Nay, MD 05/16/15 2239

## 2018-03-30 ENCOUNTER — Encounter (HOSPITAL_COMMUNITY): Payer: Self-pay | Admitting: Emergency Medicine

## 2018-03-30 ENCOUNTER — Emergency Department (HOSPITAL_COMMUNITY)
Admission: EM | Admit: 2018-03-30 | Discharge: 2018-03-30 | Disposition: A | Discharge status: Home / Self Care | Payer: Medicaid Other | Attending: Emergency Medicine | Admitting: Emergency Medicine

## 2018-03-30 DIAGNOSIS — Y929 Unspecified place or not applicable: Secondary | ICD-10-CM | POA: Insufficient documentation

## 2018-03-30 DIAGNOSIS — Y998 Other external cause status: Secondary | ICD-10-CM | POA: Insufficient documentation

## 2018-03-30 DIAGNOSIS — Y939 Activity, unspecified: Secondary | ICD-10-CM | POA: Diagnosis not present

## 2018-03-30 DIAGNOSIS — S0086XA Insect bite (nonvenomous) of other part of head, initial encounter: Secondary | ICD-10-CM | POA: Diagnosis present

## 2018-03-30 DIAGNOSIS — W57XXXA Bitten or stung by nonvenomous insect and other nonvenomous arthropods, initial encounter: Secondary | ICD-10-CM | POA: Insufficient documentation

## 2018-03-30 DIAGNOSIS — Z79899 Other long term (current) drug therapy: Secondary | ICD-10-CM | POA: Insufficient documentation

## 2018-03-30 NOTE — ED Provider Notes (Signed)
[facility] EMERGENCY DEPARTMENT Provider Note   CSN: [id number] Arrival date & time: 03/30/18  1204     History   Chief Complaint Chief Complaint  Patient presents with  . Facial wound    chin    HPI Susan Hays is a 47 y.o. female.  47 y.o female with a PMH of HTN (non-compliant with medication) presents to the ED with a chief complaint of facial wound since last night. She reports she had a bite last night when she woke up this morning and the wound was oozing and had drainage coming out of it when wiping her face. She states not trying any medical therapy for her wound. She denies any fever, headache, chest pain or shortness of breath.      History reviewed. No pertinent past medical history.  There are no active problems to display for this patient.   Past Surgical History:  Procedure Laterality Date  . CESAREAN SECTION       OB History   None      Home Medications    Prior to Admission medications   Medication Sig Start Date End Date Taking? Authorizing Provider  acetaminophen (TYLENOL) 500 MG tablet Take 1,000 mg by mouth every 6 (six) hours as needed for moderate pain.    [provider]  chlorthalidone (HYGROTEN) 100 MG tablet Take 0.5 tablets (50 mg total) by mouth daily. 05/06/15   Hedges, Tinnie Gens, PA-C  HYDROcodone-acetaminophen (NORCO) 5-325 MG per tablet Take 1 tablet by mouth every 4 (four) hours as needed for pain. 04/08/13   Arthor Captain, PA-C    Family History No family history on file.  Social History Social History   Tobacco Use  . Smoking status: Never Smoker  Substance Use Topics  . Alcohol use: No  . Drug use: No     Allergies   Patient has no known allergies.   Review of Systems Review of Systems  Constitutional: Negative for chills and fever.  HENT: Negative for sore throat.   Respiratory: Negative for shortness of breath.   Cardiovascular: Negative for chest pain.  Gastrointestinal:  Negative for abdominal pain, diarrhea, nausea and vomiting.  Genitourinary: Negative for dysuria and flank pain.  Musculoskeletal: Negative for back pain and myalgias.  Skin: Positive for wound. Negative for pallor.  Neurological: Negative for light-headedness.  All other systems reviewed and are negative.    Physical Exam Updated Vital Signs BP (!) 201/87   Pulse 88   Temp 99.6 F (37.6 C)   Resp 18   LMP 03/25/2018   SpO2 100%   Physical Exam  Constitutional: She is oriented to person, place, and time. She appears well-developed and well-nourished.  Lying in bed with tears down her face.   HENT:  Head: Normocephalic and atraumatic.    Erythematous, Macerated, oozing yellow drainage from the wound on front chin.  No streaking, fluctuance, or induration noted on exam.   Neck: Normal range of motion. Neck supple.  Cardiovascular: Normal heart sounds.  Pulmonary/Chest: Effort normal. She has no wheezes.  Abdominal: Soft. There is no tenderness.  Musculoskeletal: She exhibits no tenderness.  Neurological: She is alert and oriented to person, place, and time.  Skin: Skin is warm and dry.  Nursing note and vitals reviewed.      ED Treatments / Results  Labs (all labs ordered are listed, but only abnormal results are displayed) Labs Reviewed - No data to display  EKG None  Radiology No results  found.  Procedures Procedures (including critical care time)  Medications Ordered in ED Medications - No data to display   Initial Impression / Assessment and Plan / ED Course  I have reviewed the triage vital signs and the nursing notes.  Pertinent labs & imaging results that were available during my care of the patient were reviewed by me and considered in my medical decision making (see chart for details).  Presents with patient wound to her chin which started last night after she thinks she got bit by an insect.  The wound was very small yesterday when she went to  bed but actually woke up and had drainage coming from the wound describing it and yellow color and oozing.  Patient's blood pressure was elevated during triage note I have asked patient if she has a history of high blood pressure she reports "I do not, no I am not taking medications for it right now ".  At this time I have advised patient that she needs to get her blood pressure further manage. Denies any fever, chills, pruritus, burning sensation.   Have patient apply Neosporin to the wound for the next couple days along with keeping it dry and clean, she may take Tylenol or ibuprofen for pain along with Benadryl.  No indication for antibiotics at this time seeing as this infection appeared yesterday and patient denies any fevers along with her vitals are stable aside from her blood pressure which she does not currently take medication for blood pressure.  Return precautions provided.  Final Clinical Impressions(s) / ED Diagnoses   Final diagnoses:  Insect bite of face, initial encounter    ED Discharge Orders    None       Claude Manges, PA-C 03/30/18 1337    Gwyneth Sprout, MD 04/01/18 1351

## 2018-03-30 NOTE — ED Triage Notes (Addendum)
Pt states she noticed a spot on her chin last night at work. She woke up this morning and noticed the spot was drainage, yellow drainage noted to the chin. *BP was checked twice. States she is supposed to take meds for BP but does not.

## 2018-03-30 NOTE — Discharge Instructions (Addendum)
You may alternate ibuprofen or tylenol for your pain. You may apply neosporin to your wound, keeping clean and dry. You may also take benadryl over the counter to help with any itching. If you experience a fever, or worsening symptoms or wound does not resolve in 1 week you can return to the ED for reevaluation.

## 2018-03-30 NOTE — ED Notes (Signed)
Pt stable, ambulatory, states understanding of discharge instructions 

## 2019-12-18 ENCOUNTER — Other Ambulatory Visit: Payer: Self-pay

## 2019-12-18 ENCOUNTER — Other Ambulatory Visit: Payer: Self-pay | Admitting: Family Medicine

## 2019-12-18 ENCOUNTER — Ambulatory Visit: Payer: Self-pay

## 2019-12-18 DIAGNOSIS — M25562 Pain in left knee: Secondary | ICD-10-CM

## 2020-02-29 ENCOUNTER — Other Ambulatory Visit: Payer: Self-pay

## 2020-02-29 ENCOUNTER — Ambulatory Visit (HOSPITAL_COMMUNITY)
Admission: EM | Admit: 2020-02-29 | Discharge: 2020-02-29 | Disposition: A | Discharge status: Other Acute Inpatient Hospital | Payer: Medicaid Other | Attending: Internal Medicine | Admitting: Internal Medicine

## 2020-02-29 ENCOUNTER — Inpatient Hospital Stay (HOSPITAL_COMMUNITY)
Admission: EM | Admit: 2020-02-29 | Discharge: 2020-03-04 | DRG: 305 | Disposition: A | Discharge status: Home / Self Care | Payer: Medicaid Other | Attending: Internal Medicine | Admitting: Internal Medicine

## 2020-02-29 ENCOUNTER — Encounter (HOSPITAL_COMMUNITY): Payer: Self-pay

## 2020-02-29 ENCOUNTER — Emergency Department (HOSPITAL_COMMUNITY): Payer: Medicaid Other

## 2020-02-29 ENCOUNTER — Encounter (HOSPITAL_COMMUNITY): Payer: Self-pay | Admitting: *Deleted

## 2020-02-29 DIAGNOSIS — I161 Hypertensive emergency: Secondary | ICD-10-CM | POA: Diagnosis not present

## 2020-02-29 DIAGNOSIS — I517 Cardiomegaly: Secondary | ICD-10-CM | POA: Diagnosis present

## 2020-02-29 DIAGNOSIS — I5082 Biventricular heart failure: Secondary | ICD-10-CM | POA: Diagnosis present

## 2020-02-29 DIAGNOSIS — Z9114 Patient's other noncompliance with medication regimen: Secondary | ICD-10-CM

## 2020-02-29 DIAGNOSIS — R778 Other specified abnormalities of plasma proteins: Secondary | ICD-10-CM | POA: Diagnosis present

## 2020-02-29 DIAGNOSIS — Z79899 Other long term (current) drug therapy: Secondary | ICD-10-CM

## 2020-02-29 DIAGNOSIS — Z20822 Contact with and (suspected) exposure to covid-19: Secondary | ICD-10-CM | POA: Diagnosis present

## 2020-02-29 DIAGNOSIS — Z6838 Body mass index (BMI) 38.0-38.9, adult: Secondary | ICD-10-CM

## 2020-02-29 DIAGNOSIS — I2729 Other secondary pulmonary hypertension: Secondary | ICD-10-CM | POA: Diagnosis present

## 2020-02-29 DIAGNOSIS — I169 Hypertensive crisis, unspecified: Secondary | ICD-10-CM | POA: Diagnosis not present

## 2020-02-29 DIAGNOSIS — I50812 Chronic right heart failure: Secondary | ICD-10-CM | POA: Diagnosis present

## 2020-02-29 DIAGNOSIS — I160 Hypertensive urgency: Principal | ICD-10-CM | POA: Diagnosis present

## 2020-02-29 DIAGNOSIS — I110 Hypertensive heart disease with heart failure: Secondary | ICD-10-CM | POA: Diagnosis present

## 2020-02-29 DIAGNOSIS — I420 Dilated cardiomyopathy: Secondary | ICD-10-CM | POA: Diagnosis present

## 2020-02-29 DIAGNOSIS — I081 Rheumatic disorders of both mitral and tricuspid valves: Secondary | ICD-10-CM | POA: Diagnosis present

## 2020-02-29 DIAGNOSIS — I5042 Chronic combined systolic (congestive) and diastolic (congestive) heart failure: Secondary | ICD-10-CM | POA: Diagnosis present

## 2020-02-29 HISTORY — DX: Essential (primary) hypertension: I10

## 2020-02-29 LAB — CBC
HCT: 37.8 % (ref 36.0–46.0)
Hemoglobin: 11.1 g/dL — ABNORMAL LOW (ref 12.0–15.0)
MCH: 26 pg (ref 26.0–34.0)
MCHC: 29.4 g/dL — ABNORMAL LOW (ref 30.0–36.0)
MCV: 88.5 fL (ref 80.0–100.0)
Platelets: 317 10*3/uL (ref 150–400)
RBC: 4.27 MIL/uL (ref 3.87–5.11)
RDW: 14.9 % (ref 11.5–15.5)
WBC: 10.6 10*3/uL — ABNORMAL HIGH (ref 4.0–10.5)
nRBC: 0 % (ref 0.0–0.2)

## 2020-02-29 LAB — BASIC METABOLIC PANEL
Anion gap: 13 (ref 5–15)
BUN: 18 mg/dL (ref 6–20)
CO2: 24 mmol/L (ref 22–32)
Calcium: 8.1 mg/dL — ABNORMAL LOW (ref 8.9–10.3)
Chloride: 101 mmol/L (ref 98–111)
Creatinine, Ser: 1.02 mg/dL — ABNORMAL HIGH (ref 0.44–1.00)
GFR calc Af Amer: >60 mL/min (ref >60)
GFR calc non Af Amer: >60 mL/min (ref >60)
Glucose, Bld: 144 mg/dL — ABNORMAL HIGH (ref 70–99)
Potassium: 3.9 mmol/L (ref 3.5–5.1)
Sodium: 138 mmol/L (ref 135–145)

## 2020-02-29 LAB — TROPONIN I (HIGH SENSITIVITY)
Troponin I (High Sensitivity): 142 ng/L (ref <18)
Troponin I (High Sensitivity): 169 ng/L (ref <18)

## 2020-02-29 LAB — I-STAT BETA HCG BLOOD, ED (MC, WL, AP ONLY): I-stat hCG, quantitative: <5 m[IU]/mL (ref <5)

## 2020-02-29 LAB — SARS CORONAVIRUS 2 BY RT PCR (HOSPITAL ORDER, PERFORMED IN ~~LOC~~ HOSPITAL LAB): SARS Coronavirus 2: NEGATIVE

## 2020-02-29 MED ORDER — SODIUM CHLORIDE 0.9% FLUSH
3.0000 mL | Freq: Two times a day (BID) | INTRAVENOUS | Status: DC
  Administered 2020-03-01 – 2020-03-04 (×3): 3 mL via INTRAVENOUS

## 2020-02-29 MED ORDER — ENOXAPARIN SODIUM 40 MG/0.4ML ~~LOC~~ SOLN
40.0000 mg | SUBCUTANEOUS | Status: DC
  Administered 2020-03-01 – 2020-03-02 (×2): 40 mg via SUBCUTANEOUS
  Filled 2020-02-29 (×4): qty 0.4

## 2020-02-29 MED ORDER — SODIUM CHLORIDE 0.9% FLUSH
3.0000 mL | Freq: Two times a day (BID) | INTRAVENOUS | Status: DC
  Administered 2020-03-01 – 2020-03-04 (×7): 3 mL via INTRAVENOUS

## 2020-02-29 MED ORDER — SODIUM CHLORIDE 0.9 % IV SOLN: 250.0000 mL | INTRAVENOUS | Status: DC | PRN

## 2020-02-29 MED ORDER — ACETAMINOPHEN 650 MG RE SUPP: 650.0000 mg | Freq: Four times a day (QID) | RECTAL | Status: DC | PRN

## 2020-02-29 MED ORDER — POLYETHYLENE GLYCOL 3350 17 G PO PACK: 17.0000 g | PACK | Freq: Every day | ORAL | Status: DC | PRN

## 2020-02-29 MED ORDER — ONDANSETRON HCL 4 MG/2ML IJ SOLN: 4.0000 mg | Freq: Four times a day (QID) | INTRAMUSCULAR | Status: DC | PRN

## 2020-02-29 MED ORDER — LABETALOL HCL 5 MG/ML IV SOLN
10.0000 mg | Freq: Once | INTRAVENOUS | Status: AC
  Administered 2020-02-29: 10 mg via INTRAVENOUS
  Filled 2020-02-29: qty 4

## 2020-02-29 MED ORDER — ACETAMINOPHEN 325 MG PO TABS: 650.0000 mg | ORAL_TABLET | Freq: Four times a day (QID) | ORAL | Status: DC | PRN

## 2020-02-29 MED ORDER — HYDRALAZINE HCL 20 MG/ML IJ SOLN: 20.0000 mg | Freq: Once | INTRAMUSCULAR | Status: DC

## 2020-02-29 MED ORDER — HYDROCODONE-ACETAMINOPHEN 5-325 MG PO TABS: 1.0000 | ORAL_TABLET | ORAL | Status: DC | PRN

## 2020-02-29 MED ORDER — SODIUM CHLORIDE 0.9% FLUSH: 3.0000 mL | INTRAVENOUS | Status: DC | PRN

## 2020-02-29 MED ORDER — LABETALOL HCL 5 MG/ML IV SOLN
10.0000 mg | INTRAVENOUS | Status: DC | PRN
  Administered 2020-03-01 (×3): 10 mg via INTRAVENOUS
  Filled 2020-02-29 (×2): qty 4

## 2020-02-29 MED ORDER — ONDANSETRON HCL 4 MG PO TABS: 4.0000 mg | ORAL_TABLET | Freq: Four times a day (QID) | ORAL | Status: DC | PRN

## 2020-02-29 NOTE — ED Provider Notes (Signed)
[facility] EMERGENCY DEPARTMENT Provider Note   CSN: [id number] Arrival date & time: 02/29/20  1107     History Chief Complaint  Patient presents with  . Hypertension    Susan Hays is a 49 y.o. female.  She presented to urgent care this AM with several days of intermittent throbbing generalized headaches that abate with Excedrin. Blood pressure 250s/120s there, and she was sent to the ED for further evaluation.   She was previously on clonidine and another unknown medication for blood pressure prescribed by her old PCP, Dr. Marlana Latus. He moved away, and patient has been without PCP or BP meds for 5-6 months.  At time of encounter, headache has resolved. Patient denies ever having chest pain, shortness of breath, visual disturbance, or abdominal pain.    Hypertension This is a chronic problem. The problem occurs constantly. The problem has been gradually worsening. Associated symptoms include headaches. Pertinent negatives include no chest pain, no abdominal pain and no shortness of breath. The symptoms are aggravated by stress. She has tried nothing for the symptoms.       Past Medical History:  Diagnosis Date  . Hypertension     Patient Active Problem List   Diagnosis Date Noted  . Hypertensive crisis 02/29/2020  . Elevated troponin 02/29/2020    Past Surgical History:  Procedure Laterality Date  . CESAREAN SECTION       OB History   No obstetric history on file.     Family History  Problem Relation Age of Onset  . Sudden Cardiac Death Neg Hx     Social History   Tobacco Use  . Smoking status: Never Smoker  . Smokeless tobacco: Never Used  Substance Use Topics  . Alcohol use: No  . Drug use: No    Home Medications Prior to Admission medications   Medication Sig Start Date End Date Taking? Authorizing Provider  acetaminophen (TYLENOL) 500 MG tablet Take 1,000 mg by mouth every 6 (six) hours as needed for moderate pain.   Yes  [provider]  aspirin-acetaminophen-caffeine (EXCEDRIN EXTRA STRENGTH) [phone] MG tablet Take 1-2 tablets by mouth every 6 (six) hours as needed for headache or migraine.   Yes [provider]  ibuprofen (ADVIL) 200 MG tablet Take 800 mg by mouth every 6 (six) hours as needed (for headaches).   Yes [provider]  amLODipine (NORVASC) 5 MG tablet Take 5 mg by mouth daily.    [provider]  chlorthalidone (HYGROTEN) 100 MG tablet Take 0.5 tablets (50 mg total) by mouth daily. Patient not taking: Reported on 02/29/2020 05/06/15   Eyvonne Mechanic, PA-C  cloNIDine (CATAPRES) 0.1 MG tablet Take 0.2 mg by mouth 2 (two) times daily.    [provider]  hydrochlorothiazide (HYDRODIURIL) 12.5 MG tablet Take 12.5 mg by mouth daily.    [provider]    Allergies    Patient has no known allergies.  Review of Systems   Review of Systems  Constitutional: Negative for chills and fever.  HENT: Negative for ear pain and sore throat.   Eyes: Negative for pain and visual disturbance.  Respiratory: Negative for cough and shortness of breath.   Cardiovascular: Negative for chest pain and palpitations.  Gastrointestinal: Negative for abdominal pain and vomiting.  Genitourinary: Negative for decreased urine volume, dysuria and hematuria.  Musculoskeletal: Negative for arthralgias and back pain.  Skin: Negative for color change and rash.  Neurological: Positive for headaches. Negative for seizures and  syncope.  All other systems reviewed and are negative.   Physical Exam Updated Vital Signs BP (!) 181/101   Pulse 91   Temp 98.7 F (37.1 C) (Oral)   Resp (!) 26   Ht 5\' 5"  (1.651 m)   Wt 104.3 kg   SpO2 98%   BMI 38.27 kg/m   Physical Exam Vitals and nursing note reviewed.  Constitutional:      General: She is not in acute distress.    Appearance: She is well-developed.  HENT:     Head: Normocephalic and atraumatic.  Eyes:      Extraocular Movements: Extraocular movements intact.     Conjunctiva/sclera: Conjunctivae normal.     Pupils: Pupils are equal, round, and reactive to light.  Cardiovascular:     Rate and Rhythm: Normal rate and regular rhythm.     Heart sounds: No murmur heard.   Pulmonary:     Effort: Pulmonary effort is normal. No respiratory distress.     Breath sounds: Normal breath sounds.  Abdominal:     Palpations: Abdomen is soft.     Tenderness: There is no abdominal tenderness.  Musculoskeletal:     Cervical back: Neck supple.  Skin:    General: Skin is warm and dry.  Neurological:     General: No focal deficit present.     Mental Status: She is alert.     Cranial Nerves: No cranial nerve deficit.     Motor: No weakness.     Coordination: Coordination normal.     Gait: Gait normal.     ED Results / Procedures / Treatments   Labs (all labs ordered are listed, but only abnormal results are displayed) Labs Reviewed  BASIC METABOLIC PANEL - Abnormal; Notable for the following components:      Result Value   Glucose, Bld 144 (*)    Creatinine, Ser 1.02 (*)    Calcium 8.1 (*)    All other components within normal limits  CBC - Abnormal; Notable for the following components:   WBC 10.6 (*)    Hemoglobin 11.1 (*)    MCHC 29.4 (*)    All other components within normal limits  TROPONIN I (HIGH SENSITIVITY) - Abnormal; Notable for the following components:   Troponin I (High Sensitivity) 142 (*)    All other components within normal limits  TROPONIN I (HIGH SENSITIVITY) - Abnormal; Notable for the following components:   Troponin I (High Sensitivity) 169 (*)    All other components within normal limits  SARS CORONAVIRUS 2 BY RT PCR (HOSPITAL ORDER, PERFORMED IN [facility])  HIV ANTIBODY (ROUTINE TESTING W REFLEX)  BASIC METABOLIC PANEL  CBC  I-STAT BETA HCG BLOOD, ED ([facility], [facility], [facility] ONLY)    EKG EKG Interpretation  Date/Time:  Monday February 29 2020 12:10:47  EDT Ventricular Rate:  109 PR Interval:  132 QRS Duration: 72 QT Interval:  340 QTC Calculation: 457 R Axis:   102 Text Interpretation: Sinus tachycardia with Premature atrial complexes with Abberant conduction Rightward axis Cannot rule out Anterior infarct , age undetermined Abnormal ECG No significant change since last tracing Confirmed by 03-12-1972 [phone]) on 02/29/2020 6:22:33 PM   Radiology DG Chest 2 View  Result Date: 02/29/2020 CLINICAL DATA:  Malignant hypertension EXAM: CHEST - 2 VIEW COMPARISON:  07/25/2007 FINDINGS: Lung volumes are small, but are symmetric. Minimal bibasilar atelectasis. No pneumothorax or pleural effusion. Mild cardiomegaly has developed, new since prior examination. The pulmonary vascularity is  normal. No acute bone abnormality. IMPRESSION: Interval development of mild cardiomegaly. Electronically Signed   By: Helyn Numbers MD   On: 02/29/2020 21:56    Procedures Procedures (including critical care time)  Medications Ordered in ED Medications  enoxaparin (LOVENOX) injection 40 mg (has no administration in time range)  sodium chloride flush (NS) 0.9 % injection 3 mL (has no administration in time range)  sodium chloride flush (NS) 0.9 % injection 3 mL (has no administration in time range)  sodium chloride flush (NS) 0.9 % injection 3 mL (has no administration in time range)  0.9 %  sodium chloride infusion (has no administration in time range)  acetaminophen (TYLENOL) tablet 650 mg (has no administration in time range)    Or  acetaminophen (TYLENOL) suppository 650 mg (has no administration in time range)  HYDROcodone-acetaminophen (NORCO/VICODIN) 5-325 MG per tablet 1-2 tablet (has no administration in time range)  polyethylene glycol (MIRALAX / GLYCOLAX) packet 17 g (has no administration in time range)  ondansetron (ZOFRAN) tablet 4 mg (has no administration in time range)    Or  ondansetron (ZOFRAN) injection 4 mg (has no administration in  time range)  labetalol (NORMODYNE) injection 10 mg (has no administration in time range)  labetalol (NORMODYNE) injection 10 mg (10 mg Intravenous Given 02/29/20 1954)    ED Course  I have reviewed the triage vital signs and the nursing notes.  Pertinent labs & imaging results that were available during my care of the patient were reviewed by me and considered in my medical decision making (see chart for details).    MDM Rules/Calculators/A&P                          Hypertensive urgency in the setting of lack of medication for several months. Symptoms not consistent with pheochromocytoma.   Headache, which has since resolved, is not concerning for ICH, as headache is waxing and waning and she has a normal neuro exam. Not meningitic. Favored to be related to hypertension vs tension type headache.   Patient is not having chest pain, do not suspect ACS. Elevated troponin likely secondary to hypertensive emergency.  Patient has medicaid, will need to look up her assigned PCP for eventual outpatient follow up.   After labetolol, she had improvement in her BP to 190s/100s.   Discussed with cardiology, plan to admit for BP control and ECHO in the AM.    Final Clinical Impression(s) / ED Diagnoses Final diagnoses:  Hypertensive urgency    Rx / DC Orders ED Discharge Orders    None       Allayne Butcher, MD 02/29/20 6962    Jacalyn Lefevre, MD 03/01/20 [id number]

## 2020-02-29 NOTE — ED Notes (Addendum)
Patient is being discharged from the Urgent Care and sent to the Emergency Department via wheel chair w/UC staff . Per Dr Leonides Grills, patient is in need of higher level of care due to needing higher level of care. Patient is aware and verbalizes understanding of plan of care.  Vitals:   02/29/20 1037  BP: (!) 252/121  Pulse: (!) 116  Resp: 18  Temp: 98.1 F (36.7 C)  SpO2: 96%

## 2020-02-29 NOTE — Discharge Instructions (Signed)
I will advise you go to the emergency department to be treated further.  You may need IV medications and close monitoring.

## 2020-02-29 NOTE — H&P (Signed)
History and Physical    Susan Hays WVP:[id number] DOB: 11/01/1970 DOA: 02/29/2020  PCP: Patient, No Pcp Per   Patient coming from: Home   Chief Complaint: Headaches, high BP   HPI: Susan Hays is a 49 y.o. female with medical history significant for hypertension and BMI 38, now presenting to the emergency department with uncontrolled hypertension and headaches.  Patient reports that she had previously been on multiple antihypertensives but has not been able to fill any of these medications in roughly 6 months, has noted her blood pressure to be severely elevated at home with SBP reaching 250 at times.  She has had some intermittent headaches associated with this but no change in vision and no focal numbness or weakness.  She denies any chest pain, shortness of breath, or leg swelling.  She went to an urgent care today for evaluation of this, was found to be severely hypertensive, and directed to the ED for further evaluation.  She denies any tobacco, alcohol, or illicit substance use.  ED Course: Upon arrival to the ED, patient is found to be afebrile, saturating well on room air, and hypertensive to 250/120.  EKG features sinus tachycardia with rate 109 and PVCs.  Chemistry panel with creatinine 1.02.  CBC with mild leukocytosis.  High-sensitivity troponin elevated 242 and then 169.  Covid PCR not yet resulted.  Patient was treated with IV labetalol in the ED.  Cardiology was consulted by the ED physician and recommended a medical admission for BP control and echocardiogram in the morning.  Review of Systems:  All other systems reviewed and apart from HPI, are negative.  Past Medical History:  Diagnosis Date   Hypertension     Past Surgical History:  Procedure Laterality Date   CESAREAN SECTION      Social History:   reports that she has never smoked. She has never used smokeless tobacco. She reports that she does not drink alcohol and does not use drugs.  No Known  Allergies  Family History  Problem Relation Age of Onset   Sudden Cardiac Death Neg Hx      Prior to Admission medications   Medication Sig Start Date End Date Taking? Authorizing Provider  acetaminophen (TYLENOL) 500 MG tablet Take 1,000 mg by mouth every 6 (six) hours as needed for moderate pain.   Yes [provider]  aspirin-acetaminophen-caffeine (EXCEDRIN EXTRA STRENGTH) [phone] MG tablet Take 1-2 tablets by mouth every 6 (six) hours as needed for headache or migraine.   Yes [provider]  ibuprofen (ADVIL) 200 MG tablet Take 800 mg by mouth every 6 (six) hours as needed (for headaches).   Yes [provider]  amLODipine (NORVASC) 5 MG tablet Take 5 mg by mouth daily.    [provider]  chlorthalidone (HYGROTEN) 100 MG tablet Take 0.5 tablets (50 mg total) by mouth daily. Patient not taking: Reported on 02/29/2020 05/06/15   Eyvonne Mechanic, PA-C  cloNIDine (CATAPRES) 0.1 MG tablet Take 0.2 mg by mouth 2 (two) times daily.    [provider]  hydrochlorothiazide (HYDRODIURIL) 12.5 MG tablet Take 12.5 mg by mouth daily.    [provider]    Physical Exam: Vitals:   02/29/20 1701 02/29/20 1930 02/29/20 2000 02/29/20 2030  BP: (!) 134/117 (!) 216/129 (!) 193/104 (!) 181/101  Pulse: 97 (!) 103 87 91  Resp: 15 (!) 23 (!) 29 (!) 26  Temp:      TempSrc:      SpO2:  95% 96% 97% 98%  Weight:      Height:        Constitutional: NAD, calm  Eyes: PERTLA, lids and conjunctivae normal ENMT: Mucous membranes are moist. Posterior pharynx clear of any exudate or lesions.   Neck: normal, supple, no masses, no thyromegaly Respiratory:  no wheezing, no crackles. No accessory muscle use.  Cardiovascular: S1 & S2 heard, regular rate and rhythm. No extremity edema.   Abdomen: No distension, no tenderness, soft. Bowel sounds active.  Musculoskeletal: no clubbing / cyanosis. No joint deformity upper and lower extremities.   Skin: no  significant rashes, lesions, ulcers. Warm, dry, well-perfused. Neurologic: CN 2-12 grossly intact. Sensation intact. Strength 5/5 in all 4 limbs.  Psychiatric: Alert and oriented to person, place, and situation. Calm and cooperative.    Labs and Imaging on Admission: I have personally reviewed following labs and imaging studies  CBC: Recent Labs  Lab 02/29/20 1214  WBC 10.6*  HGB 11.1*  HCT 37.8  MCV 88.5  PLT 317   Basic Metabolic Panel: Recent Labs  Lab 02/29/20 1214  NA 138  K 3.9  CL 101  CO2 24  GLUCOSE 144*  BUN 18  CREATININE 1.02*  CALCIUM 8.1*   GFR: Estimated Creatinine Clearance: 79.1 mL/min (A) (by C-G formula based on SCr of 1.02 mg/dL (H)). Liver Function Tests: No results for input(s): AST, ALT, ALKPHOS, BILITOT, PROT, ALBUMIN in the last 168 hours. No results for input(s): LIPASE, AMYLASE in the last 168 hours. No results for input(s): AMMONIA in the last 168 hours. Coagulation Profile: No results for input(s): INR, PROTIME in the last 168 hours. Cardiac Enzymes: No results for input(s): CKTOTAL, CKMB, CKMBINDEX, TROPONINI in the last 168 hours. BNP (last 3 results) No results for input(s): PROBNP in the last 8760 hours. HbA1C: No results for input(s): HGBA1C in the last 72 hours. CBG: No results for input(s): GLUCAP in the last 168 hours. Lipid Profile: No results for input(s): CHOL, HDL, LDLCALC, TRIG, CHOLHDL, LDLDIRECT in the last 72 hours. Thyroid Function Tests: No results for input(s): TSH, T4TOTAL, FREET4, T3FREE, THYROIDAB in the last 72 hours. Anemia Panel: No results for input(s): VITAMINB12, FOLATE, FERRITIN, TIBC, IRON, RETICCTPCT in the last 72 hours. Urine analysis: No results found for: COLORURINE, APPEARANCEUR, LABSPEC, PHURINE, GLUCOSEU, HGBUR, BILIRUBINUR, KETONESUR, PROTEINUR, UROBILINOGEN, NITRITE, LEUKOCYTESUR Sepsis Labs: @LABRCNTIP (procalcitonin:4,lacticidven:4) )No results found for this or any previous visit (from the  past 240 hour(s)).   Radiological Exams on Admission: No results found.  EKG: Independently reviewed. Sinus tachycardia (rate 109), PACs.   Assessment/Plan   1. Hypertensive crisis  - Patient with hx of HTN has been out of medications for ~6 months and presents with severe hypertension and intermittent headaches, denies HA or chest pain in ED but is severely hypertensive with elevated troponin and cardiology recommended admission for BP control and echocardiogram  - BP improved after IV labetalol in ED  - Resume oral antihypertensives starting with Norvasc and HCTZ, use labetalol as needed, check echocardiogram    2. Elevated troponin  - HS troponin 142 and then 169  - There are no anginal complaints and this is likely secondary to severe HTN  - Control BP and check echocardiogram per cardiology recommendations     DVT prophylaxis: Lovenox  Code Status: Full  Family Communication: Discussed with patient  Disposition Plan:  Patient is from: Home  Anticipated d/c is to: Home  Anticipated d/c date is: 03/01/20 Patient currently: Pending BP control and echocardiogram  Consults called: None  Admission status: Observation     Briscoe Deutscher, MD [facility]  02/29/2020, 9:55 PM

## 2020-02-29 NOTE — ED Triage Notes (Signed)
Pt reports checking her BP at home due to headache and found sbp > 200. Hx of HTN but has been off her meds x 5 months.

## 2020-02-29 NOTE — ED Provider Notes (Signed)
[facility]    CSN: [id number] Arrival date & time: 02/29/20  0944      History   Chief Complaint Chief Complaint  Patient presents with  . Hypertension    HPI Susan Hays is a 49 y.o. female with history of hypertension comes to the urgent care with complaints of recurrent headaches over the past several days. Patient states that the headaches usually worse at night and associated with feeling of fainting. Patient denies any chest pain, shortness of breath, lower extremity swelling. She is developed increased puffiness over her face and around the eyes especially in the morning. Patient was previously on chlorthalidone but has not taken any medications for over 5 to 6 months because her primary care physician moved away. She denies any calf pain or numbness or tingling.  Patient describes the headache as pounding, 8 out of 10. No numbness or tingling. No abdominal pain.  Past Medical History:  Diagnosis Date  . Hypertension     There are no problems to display for this patient.   Past Surgical History:  Procedure Laterality Date  . CESAREAN SECTION      OB History   No obstetric history on file.      Home Medications    Prior to Admission medications   Medication Sig Start Date End Date Taking? Authorizing Provider  acetaminophen (TYLENOL) 500 MG tablet Take 1,000 mg by mouth every 6 (six) hours as needed for moderate pain.    [provider]  chlorthalidone (HYGROTEN) 100 MG tablet Take 0.5 tablets (50 mg total) by mouth daily. 05/06/15   Eyvonne Mechanic, PA-C    Family History No family history on file.  Social History Social History   Tobacco Use  . Smoking status: Never Smoker  Substance Use Topics  . Alcohol use: No  . Drug use: No     Allergies   Patient has no known allergies.   Review of Systems Review of Systems  Constitutional: Negative.   Respiratory: Negative.  Negative for chest tightness, shortness of breath  and wheezing.   Cardiovascular: Negative for chest pain.  Gastrointestinal: Negative for abdominal pain.  Genitourinary: Negative.   Neurological: Positive for headaches.     Physical Exam Triage Vital Signs ED Triage Vitals  Enc Vitals Group     BP 02/29/20 1037 (!) 252/121     Pulse Rate 02/29/20 1037 (!) 116     Resp 02/29/20 1037 18     Temp 02/29/20 1037 98.1 F (36.7 C)     Temp Source 02/29/20 1037 Oral     SpO2 02/29/20 1037 96 %     Weight 02/29/20 1035 230 lb (104.3 kg)     Height 02/29/20 1035 5\' 5"  (1.651 m)     Head Circumference --      Peak Flow --      Pain Score 02/29/20 1035 8     Pain Loc --      Pain Edu? --      Excl. in GC? --    No data found.  Updated Vital Signs BP (!) 252/121   Pulse (!) 116   Temp 98.1 F (36.7 C) (Oral)   Resp 18   Ht 5\' 5"  (1.651 m)   Wt 104.3 kg   SpO2 96%   BMI 38.27 kg/m   Visual Acuity Right Eye Distance:   Left Eye Distance:   Bilateral Distance:    Right Eye Near:   Left Eye Near:  Bilateral Near:     Physical Exam Vitals and nursing note reviewed.  Constitutional:      General: She is not in acute distress.    Appearance: She is not ill-appearing.  Cardiovascular:     Rate and Rhythm: Normal rate and regular rhythm.     Pulses: Normal pulses.     Heart sounds: Normal heart sounds.  Pulmonary:     Effort: Pulmonary effort is normal.     Breath sounds: Normal breath sounds.  Abdominal:     General: Bowel sounds are normal. There is no distension.     Palpations: Abdomen is soft.  Musculoskeletal:        General: Normal range of motion.  Skin:    General: Skin is warm.  Neurological:     Mental Status: She is alert.      UC Treatments / Results  Labs (all labs ordered are listed, but only abnormal results are displayed) Labs Reviewed - No data to display  EKG   Radiology No results found.  Procedures Procedures (including critical care time)  Medications Ordered in  UC Medications - No data to display  Initial Impression / Assessment and Plan / UC Course  I have reviewed the triage vital signs and the nursing notes.  Pertinent labs & imaging results that were available during my care of the patient were reviewed by me and considered in my medical decision making (see chart for details).     1. Hypertensive urgency: Patient is currently restless. She denies any chest pain. Given her blood pressure with tachycardia, patient will be sent to the emergency department for further evaluation. She will need some laboratory evaluation and medications to reduce her blood pressure. Patient is agreeable. Final Clinical Impressions(s) / UC Diagnoses   Final diagnoses:  Hypertensive emergency without congestive heart failure     Discharge Instructions     I will advise you go to the emergency department to be treated further.  You may need IV medications and close monitoring.   ED Prescriptions    None     PDMP not reviewed this encounter.   Merrilee Jansky, MD 02/29/20 [phone]

## 2020-02-29 NOTE — ED Notes (Signed)
Dr Leonides Grills notified of pt's bp and pulse

## 2020-02-29 NOTE — ED Triage Notes (Signed)
Pt c/o HTN and HA started last night. PT states systolic was 248 last night. Pt denies blurred vision, N/V. Pt states been out of bp meds 5-37mos, because dr moved.

## 2020-03-01 ENCOUNTER — Observation Stay (HOSPITAL_COMMUNITY): Payer: Medicaid Other

## 2020-03-01 DIAGNOSIS — I160 Hypertensive urgency: Secondary | ICD-10-CM | POA: Diagnosis not present

## 2020-03-01 DIAGNOSIS — I340 Nonrheumatic mitral (valve) insufficiency: Secondary | ICD-10-CM

## 2020-03-01 DIAGNOSIS — I5042 Chronic combined systolic (congestive) and diastolic (congestive) heart failure: Secondary | ICD-10-CM | POA: Diagnosis present

## 2020-03-01 DIAGNOSIS — Z79899 Other long term (current) drug therapy: Secondary | ICD-10-CM | POA: Diagnosis not present

## 2020-03-01 DIAGNOSIS — I50812 Chronic right heart failure: Secondary | ICD-10-CM | POA: Diagnosis not present

## 2020-03-01 DIAGNOSIS — Z6838 Body mass index (BMI) 38.0-38.9, adult: Secondary | ICD-10-CM | POA: Diagnosis not present

## 2020-03-01 DIAGNOSIS — I5082 Biventricular heart failure: Secondary | ICD-10-CM | POA: Diagnosis present

## 2020-03-01 DIAGNOSIS — I081 Rheumatic disorders of both mitral and tricuspid valves: Secondary | ICD-10-CM | POA: Diagnosis present

## 2020-03-01 DIAGNOSIS — I361 Nonrheumatic tricuspid (valve) insufficiency: Secondary | ICD-10-CM | POA: Diagnosis not present

## 2020-03-01 DIAGNOSIS — Z9114 Patient's other noncompliance with medication regimen: Secondary | ICD-10-CM | POA: Diagnosis not present

## 2020-03-01 DIAGNOSIS — Z20822 Contact with and (suspected) exposure to covid-19: Secondary | ICD-10-CM | POA: Diagnosis present

## 2020-03-01 DIAGNOSIS — I110 Hypertensive heart disease with heart failure: Secondary | ICD-10-CM | POA: Diagnosis present

## 2020-03-01 DIAGNOSIS — I2729 Other secondary pulmonary hypertension: Secondary | ICD-10-CM | POA: Diagnosis present

## 2020-03-01 DIAGNOSIS — R778 Other specified abnormalities of plasma proteins: Secondary | ICD-10-CM | POA: Diagnosis not present

## 2020-03-01 DIAGNOSIS — I420 Dilated cardiomyopathy: Secondary | ICD-10-CM | POA: Diagnosis present

## 2020-03-01 DIAGNOSIS — I517 Cardiomegaly: Secondary | ICD-10-CM | POA: Diagnosis not present

## 2020-03-01 DIAGNOSIS — I169 Hypertensive crisis, unspecified: Secondary | ICD-10-CM | POA: Diagnosis present

## 2020-03-01 LAB — BASIC METABOLIC PANEL
Anion gap: 10 (ref 5–15)
BUN: 12 mg/dL (ref 6–20)
CO2: 27 mmol/L (ref 22–32)
Calcium: 8.1 mg/dL — ABNORMAL LOW (ref 8.9–10.3)
Chloride: 103 mmol/L (ref 98–111)
Creatinine, Ser: 1.08 mg/dL — ABNORMAL HIGH (ref 0.44–1.00)
GFR calc Af Amer: >60 mL/min (ref >60)
GFR calc non Af Amer: 60 mL/min — ABNORMAL LOW (ref >60)
Glucose, Bld: 140 mg/dL — ABNORMAL HIGH (ref 70–99)
Potassium: 3.6 mmol/L (ref 3.5–5.1)
Sodium: 140 mmol/L (ref 135–145)

## 2020-03-01 LAB — ECHOCARDIOGRAM COMPLETE
Area-P 1/2: 5.02 cm2
Height: 65 in
S' Lateral: 3.8 cm
Weight: 3680 oz

## 2020-03-01 LAB — CBC
HCT: 37.1 % (ref 36.0–46.0)
Hemoglobin: 10.8 g/dL — ABNORMAL LOW (ref 12.0–15.0)
MCH: 25.8 pg — ABNORMAL LOW (ref 26.0–34.0)
MCHC: 29.1 g/dL — ABNORMAL LOW (ref 30.0–36.0)
MCV: 88.5 fL (ref 80.0–100.0)
Platelets: 273 10*3/uL (ref 150–400)
RBC: 4.19 MIL/uL (ref 3.87–5.11)
RDW: 15 % (ref 11.5–15.5)
WBC: 8.3 10*3/uL (ref 4.0–10.5)
nRBC: 0 % (ref 0.0–0.2)

## 2020-03-01 LAB — HIV ANTIBODY (ROUTINE TESTING W REFLEX): HIV Screen 4th Generation wRfx: NONREACTIVE

## 2020-03-01 LAB — TROPONIN I (HIGH SENSITIVITY): Troponin I (High Sensitivity): 138 ng/L (ref <18)

## 2020-03-01 LAB — BRAIN NATRIURETIC PEPTIDE: B Natriuretic Peptide: 963.1 pg/mL — ABNORMAL HIGH (ref 0.0–100.0)

## 2020-03-01 MED ORDER — LOSARTAN POTASSIUM 50 MG PO TABS
50.0000 mg | ORAL_TABLET | Freq: Every day | ORAL | Status: DC
  Administered 2020-03-01 – 2020-03-02 (×2): 50 mg via ORAL
  Filled 2020-03-01 (×2): qty 1

## 2020-03-01 MED ORDER — ISOSORBIDE MONONITRATE ER 30 MG PO TB24
30.0000 mg | ORAL_TABLET | Freq: Every day | ORAL | Status: DC
  Administered 2020-03-01 – 2020-03-02 (×2): 30 mg via ORAL
  Filled 2020-03-01 (×2): qty 1

## 2020-03-01 MED ORDER — HYDRALAZINE HCL 20 MG/ML IJ SOLN
10.0000 mg | Freq: Four times a day (QID) | INTRAMUSCULAR | Status: DC | PRN
  Administered 2020-03-03: 10 mg via INTRAVENOUS
  Filled 2020-03-01: qty 1

## 2020-03-01 MED ORDER — HYDROCHLOROTHIAZIDE 25 MG PO TABS
12.5000 mg | ORAL_TABLET | Freq: Every day | ORAL | Status: DC
  Administered 2020-03-01 – 2020-03-04 (×4): 12.5 mg via ORAL
  Filled 2020-03-01 (×4): qty 1

## 2020-03-01 MED ORDER — AMLODIPINE BESYLATE 5 MG PO TABS
5.0000 mg | ORAL_TABLET | Freq: Every day | ORAL | Status: DC
  Administered 2020-03-01: 5 mg via ORAL
  Filled 2020-03-01: qty 1

## 2020-03-01 MED ORDER — AMLODIPINE BESYLATE 5 MG PO TABS
5.0000 mg | ORAL_TABLET | Freq: Two times a day (BID) | ORAL | Status: DC
  Administered 2020-03-01 – 2020-03-04 (×6): 5 mg via ORAL
  Filled 2020-03-01 (×6): qty 1

## 2020-03-01 MED ORDER — HYDRALAZINE HCL 25 MG PO TABS
25.0000 mg | ORAL_TABLET | Freq: Four times a day (QID) | ORAL | Status: DC
  Administered 2020-03-01 – 2020-03-02 (×4): 25 mg via ORAL
  Filled 2020-03-01 (×5): qty 1

## 2020-03-01 MED ORDER — CARVEDILOL 6.25 MG PO TABS
6.2500 mg | ORAL_TABLET | Freq: Two times a day (BID) | ORAL | Status: DC
  Administered 2020-03-01 – 2020-03-04 (×6): 6.25 mg via ORAL
  Filled 2020-03-01 (×6): qty 1

## 2020-03-01 MED ORDER — AMLODIPINE BESYLATE 5 MG PO TABS
5.0000 mg | ORAL_TABLET | Freq: Once | ORAL | Status: AC
  Administered 2020-03-01: 5 mg via ORAL
  Filled 2020-03-01: qty 1

## 2020-03-01 NOTE — ED Notes (Signed)
This RN in to assess patient, patient states she wants to go home, reinforced plan of care/blood pressure management, pt verbalized understanding and voiced complaint that she has only had one meal. This RN provided pt with meal bag, cheese, and soda. Patient voiced appreciation and denies further need.

## 2020-03-01 NOTE — ED Notes (Signed)
Patient ambulated to and from restroom with steady gait. Stretcher changed to one with better mattress to increase pt comfort. Pt denies further needs.

## 2020-03-01 NOTE — Consult Note (Signed)
Referring Physician: Jodean Limaalph Netty, MD  Sharrie RothmanDoris J Hays is an 49 y.o. female.                       Chief Complaint: Uncontrolled Hypertension and cardiomyopathy  HPI: 49 years old black female with PMH of hypertension and obesity ran out of blood pressure medications for 6 months. [place] moved from [place] and it took her quite some time to find her. She has headache. She had echocardiogram showing dilated cardiomyopathy with LV EF of 25-30 % with moderate diastolic dysfunction, mild to moderate MR, severe TR and moderate RV systolic dysfunction. She denies prior cardiac evaluations. EKG shows sinus tachycardia, frequent APCs and possible Antero-Septal wall MI.  Past Medical History:  Diagnosis Date  . Hypertension       Past Surgical History:  Procedure Laterality Date  . CESAREAN SECTION      Family History  Problem Relation Age of Onset  . Sudden Cardiac Death Neg Hx    Social History:  reports that she has never smoked. She has never used smokeless tobacco. She reports that she does not drink alcohol and does not use drugs.  Allergies: No Known Allergies  Medications Prior to Admission  Medication Sig Dispense Refill  . acetaminophen (TYLENOL) 500 MG tablet Take 1,000 mg by mouth every 6 (six) hours as needed for moderate pain.    Marland Kitchen. aspirin-acetaminophen-caffeine (EXCEDRIN EXTRA STRENGTH) 250-250-65 MG tablet Take 1-2 tablets by mouth every 6 (six) hours as needed for headache or migraine.    Marland Kitchen. ibuprofen (ADVIL) 200 MG tablet Take 800 mg by mouth every 6 (six) hours as needed (for headaches).    Marland Kitchen. amLODipine (NORVASC) 5 MG tablet Take 5 mg by mouth daily.    . chlorthalidone (HYGROTEN) 100 MG tablet Take 0.5 tablets (50 mg total) by mouth daily. (Patient not taking: Reported on 02/29/2020) 30 tablet 0  . cloNIDine (CATAPRES) 0.1 MG tablet Take 0.2 mg by mouth 2 (two) times daily.    . hydrochlorothiazide (HYDRODIURIL) 12.5 MG tablet Take 12.5 mg by mouth daily.       Results for orders placed or performed during the hospital encounter of 02/29/20 (from the past 48 hour(s))  Basic metabolic panel     Status: Abnormal   Collection Time: 02/29/20 12:14 PM  Result Value Ref Range   Sodium 138 135 - 145 mmol/L   Potassium 3.9 3.5 - 5.1 mmol/L   Chloride 101 98 - 111 mmol/L   CO2 24 22 - 32 mmol/L   Glucose, Bld 144 (H) 70 - 99 mg/dL    Comment: Glucose reference range applies only to samples taken after fasting for at least 8 hours.   BUN 18 6 - 20 mg/dL   Creatinine, Ser 1.611.02 (H) 0.44 - 1.00 mg/dL   Calcium 8.1 (L) 8.9 - 10.3 mg/dL   GFR calc non Af Amer >60 >60 mL/min   GFR calc Af Amer >60 >60 mL/min   Anion gap 13 5 - 15    Comment: Performed at [facility], [address] [place], [place] [id number]  CBC     Status: Abnormal   Collection Time: 02/29/20 12:14 PM  Result Value Ref Range   WBC 10.6 (H) 4.0 - 10.5 K/uL   RBC 4.27 3.87 - 5.11 MIL/uL   Hemoglobin 11.1 (L) 12.0 - 15.0 g/dL   HCT 54.037.8 36 - 46 %   MCV 88.5 80.0 - 100.0 fL  MCH 26.0 26.0 - 34.0 pg   MCHC 29.4 (L) 30.0 - 36.0 g/dL   RDW 02.7 25.3 - 66.4 %   Platelets 317 150 - 400 K/uL   nRBC 0.0 0.0 - 0.2 %    Comment: Performed at [facility], [address] [place], [place] [address]  Troponin I (High Sensitivity)     Status: Abnormal   Collection Time: 02/29/20 12:14 PM  Result Value Ref Range   Troponin I (High Sensitivity) 142 (HH) <18 ng/L    Comment: CRITICAL RESULT CALLED TO, READ BACK BY AND VERIFIED WITH: K.PATE,RN AT 1404 02/29/2020 BY ZBEECH. (NOTE) Elevated high sensitivity troponin I (hsTnI) values and significant  changes across serial measurements may suggest ACS but many other  chronic and acute conditions are known to elevate hsTnI results.  Refer to the Links section for chest pain algorithms and additional  guidance. Performed at [facility], [address], [place], [place] [address]   I-Stat beta hCG blood, ED     Status: None    Collection Time: 02/29/20 12:47 PM  Result Value Ref Range   I-stat hCG, quantitative <5.0 <5 mIU/mL   Comment 3            Comment:   GEST. AGE      CONC.  (mIU/mL)   <=1 WEEK        5 - 50     2 WEEKS       50 - 500     3 WEEKS       100 - 10,000     4 WEEKS     1,000 - 30,000        FEMALE AND NON-PREGNANT FEMALE:     LESS THAN 5 mIU/mL   Troponin I (High Sensitivity)     Status: Abnormal   Collection Time: 02/29/20  6:22 PM  Result Value Ref Range   Troponin I (High Sensitivity) 169 (HH) <18 ng/L    Comment: CRITICAL VALUE NOTED.  VALUE IS CONSISTENT WITH PREVIOUSLY REPORTED AND CALLED VALUE. (NOTE) Elevated high sensitivity troponin I (hsTnI) values and significant  changes across serial measurements may suggest ACS but many other  chronic and acute conditions are known to elevate hsTnI results.  Refer to the Links section for chest pain algorithms and additional  guidance. Performed at [facility], [address], [place], [place] [address]   SARS Coronavirus 2 by RT PCR (hospital order, performed in [facility] hospital lab) Nasopharyngeal Nasopharyngeal Swab     Status: None   Collection Time: 02/29/20  9:45 PM   Specimen: Nasopharyngeal Swab  Result Value Ref Range   SARS Coronavirus 2 NEGATIVE NEGATIVE    Comment: (NOTE) SARS-CoV-2 target nucleic acids are NOT DETECTED.  The SARS-CoV-2 RNA is generally detectable in upper and lower respiratory specimens during the acute phase of infection. The lowest concentration of SARS-CoV-2 viral copies this assay can detect is 250 copies / mL. A negative result does not preclude SARS-CoV-2 infection and should not be used as the sole basis for treatment or other patient management decisions.  A negative result may occur with improper specimen collection / handling, submission of specimen other than nasopharyngeal swab, presence of viral mutation(s) within the areas targeted by this assay, and inadequate number of  viral copies (<250 copies / mL). A negative result must be combined with clinical observations, patient history, and epidemiological information.  Fact Sheet for Patients:  BoilerBrush.com.cy  Fact Sheet for Healthcare Providers: https://pope.com/  This test is not yet approved or  cleared by the [place] FDA and has been authorized for detection and/or diagnosis of SARS-CoV-2 by FDA under an Emergency Use Authorization (EUA).  This EUA will remain in effect (meaning this test can be used) for the duration of the COVID-19 declaration under Section 564(b)(1) of the Act, 21 U.S.C. section 360bbb-3(b)(1), unless the authorization is terminated or revoked sooner.  Performed at [facility], [address] [place], [place] [address]   HIV Antibody (routine testing w rflx)     Status: None   Collection Time: 03/01/20  3:48 AM  Result Value Ref Range   HIV Screen 4th Generation wRfx Non Reactive Non Reactive    Comment: Performed at [facility], [address] [place], [place] [address]  Basic metabolic panel     Status: Abnormal   Collection Time: 03/01/20  3:48 AM  Result Value Ref Range   Sodium 140 135 - 145 mmol/L   Potassium 3.6 3.5 - 5.1 mmol/L   Chloride 103 98 - 111 mmol/L   CO2 27 22 - 32 mmol/L   Glucose, Bld 140 (H) 70 - 99 mg/dL    Comment: Glucose reference range applies only to samples taken after fasting for at least 8 hours.   BUN 12 6 - 20 mg/dL   Creatinine, Ser 1.91 (H) 0.44 - 1.00 mg/dL   Calcium 8.1 (L) 8.9 - 10.3 mg/dL   GFR calc non Af Amer 60 (L) >60 mL/min   GFR calc Af Amer >60 >60 mL/min   Anion gap 10 5 - 15    Comment: Performed at [facility], [address] [place], [place] [address]  CBC     Status: Abnormal   Collection Time: 03/01/20  3:48 AM  Result Value Ref Range   WBC 8.3 4.0 - 10.5 K/uL   RBC 4.19 3.87 - 5.11 MIL/uL   Hemoglobin 10.8 (L) 12.0 - 15.0 g/dL   HCT  56.2 36 - 46 %   MCV 88.5 80.0 - 100.0 fL   MCH 25.8 (L) 26.0 - 34.0 pg   MCHC 29.1 (L) 30.0 - 36.0 g/dL   RDW 13.0 86.5 - 78.4 %   Platelets 273 150 - 400 K/uL   nRBC 0.0 0.0 - 0.2 %    Comment: Performed at [facility], [address], [place], [place] [address]  Troponin I (High Sensitivity)     Status: Abnormal   Collection Time: 03/01/20  8:12 AM  Result Value Ref Range   Troponin I (High Sensitivity) 138 (HH) <18 ng/L    Comment: CRITICAL VALUE NOTED.  VALUE IS CONSISTENT WITH PREVIOUSLY REPORTED AND CALLED VALUE. (NOTE) Elevated high sensitivity troponin I (hsTnI) values and significant  changes across serial measurements may suggest ACS but many other  chronic and acute conditions are known to elevate hsTnI results.  Refer to the Links section for chest pain algorithms and additional  guidance. Performed at [facility], [address] [place], [place] [address]   Brain natriuretic peptide     Status: Abnormal   Collection Time: 03/01/20  8:12 AM  Result Value Ref Range   B Natriuretic Peptide 963.1 (H) 0.0 - 100.0 pg/mL    Comment: Performed at [facility], [address], [place], [place] [address]   DG Chest 2 View  Result Date: 02/29/2020 CLINICAL DATA:  Malignant hypertension EXAM: CHEST - 2 VIEW COMPARISON:  07/25/2007 FINDINGS: Lung volumes are small, but are symmetric. Minimal bibasilar atelectasis. No pneumothorax or pleural effusion. Mild cardiomegaly has developed, new since prior examination. The pulmonary vascularity is normal. No acute bone abnormality. IMPRESSION: Interval development of mild cardiomegaly. Electronically Signed   By: Helyn Numbers MD   On: 02/29/2020 21:56   ECHOCARDIOGRAM COMPLETE  Result Date: 03/01/2020    ECHOCARDIOGRAM REPORT   Patient Name:   DEMESHIA SHERBURNE Date of Exam: 03/01/2020 Medical Rec #:  [id number]        Height:       65.0 in Accession #:    [id number]       Weight:       230.0 lb Date of Birth:  [birth year]          BSA:          2.099 m Patient Age:    49 years         BP:           126/83 mmHg Patient Gender: F                HR:           99 bpm. Exam Location:  Inpatient Procedure: 2D Echo Indications:    elevated troponin  History:        Patient has no prior history of Echocardiogram examinations.                 Risk Factors:Hypertension.  Sonographer:    Delcie Roch Referring Phys: [id number] TIMOTHY S OPYD IMPRESSIONS  1. Left ventricular ejection fraction, by estimation, is 25 to 30%. The left ventricle has severely decreased function. The left ventricle demonstrates global hypokinesis. Left ventricular diastolic parameters are consistent with Grade II diastolic dysfunction (pseudonormalization). Elevated left atrial pressure. There is the interventricular septum is flattened in diastole ('D' shaped left ventricle), consistent with right ventricular volume overload.  2. Right ventricular systolic function is moderately reduced. The right ventricular size is mildly enlarged. There is severely elevated pulmonary artery systolic pressure.  3. Left atrial size was moderately dilated.  4. Right atrial size was moderately dilated.  5. Marked variation in flow velocities across the cardiac valves, apparently with a respiratory pattern, raises concern for increased ventricular interdependence (consider constrictive physiology).  6. The mitral valve is normal in structure. Mild to moderate mitral valve regurgitation. No evidence of mitral stenosis.  7. Tricuspid valve regurgitation is severe.  8. The aortic valve is normal in structure. Aortic valve regurgitation is not visualized. No aortic stenosis is present.  9. The inferior vena cava is dilated in size with <50% respiratory variability, suggesting right atrial pressure of 15 mmHg. FINDINGS  Left Ventricle: Left ventricular ejection fraction, by estimation, is 25 to 30%. The left ventricle has severely decreased function. The left ventricle demonstrates global  hypokinesis. The left ventricular internal cavity size was normal in size. There is no left ventricular hypertrophy. The interventricular septum is flattened in diastole ('D' shaped left ventricle), consistent with right ventricular volume overload. Left ventricular diastolic parameters are consistent with Grade II diastolic dysfunction (pseudonormalization). Elevated left atrial pressure. Right Ventricle: The right ventricular size is mildly enlarged. No increase in right ventricular wall thickness. Right ventricular systolic function is moderately reduced. There is severely elevated pulmonary artery systolic pressure. The tricuspid regurgitant velocity is 3.46 m/s, and with an assumed right atrial pressure of 15 mmHg, the estimated right ventricular  systolic pressure is 62.9 mmHg. Left Atrium: Left atrial size was moderately dilated. Right Atrium: Right atrial size was moderately dilated. Pericardium: Marked variation in flow velocities across the cardiac valves, apparently with a respiratory pattern, raises concern for increased ventricular interdependence (consider constrictive physiology). There is no evidence of pericardial effusion. Mitral Valve: The mitral valve is normal in structure. Mild to moderate mitral valve regurgitation, with centrally-directed jet. No evidence of mitral valve stenosis. Tricuspid Valve: The tricuspid valve is grossly normal. Tricuspid valve regurgitation is severe. No evidence of tricuspid stenosis. Aortic Valve: The aortic valve is normal in structure. Aortic valve regurgitation is not visualized. No aortic stenosis is present. Pulmonic Valve: The pulmonic valve was normal in structure. Pulmonic valve regurgitation is not visualized. No evidence of pulmonic stenosis. Aorta: The aortic root is normal in size and structure. Venous: The inferior vena cava is dilated in size with less than 50% respiratory variability, suggesting right atrial pressure of 15 mmHg. IAS/Shunts: No atrial  level shunt detected by color flow Doppler.  LEFT VENTRICLE PLAX 2D LVIDd:         4.50 cm LVIDs:         3.80 cm LV PW:         1.10 cm LV IVS:        1.10 cm LVOT diam:     1.80 cm LV SV:         41 LV SV Index:   20 LVOT Area:     2.54 cm  RIGHT VENTRICLE            IVC RV S prime:     8.49 cm/s  IVC diam: 2.10 cm TAPSE (M-mode): 1.5 cm LEFT ATRIUM             Index       RIGHT ATRIUM           Index LA Vol (A2C):   79.9 ml 38.06 ml/m RA Area:     22.80 cm LA Vol (A4C):   71.7 ml 34.16 ml/m RA Volume:   72.40 ml  34.49 ml/m LA Biplane Vol: 78.0 ml 37.16 ml/m  AORTIC VALVE LVOT Vmax:   107.00 cm/s LVOT Vmean:  69.600 cm/s LVOT VTI:    0.161 m  AORTA Ao Root diam: 2.90 cm Ao Asc diam:  3.10 cm MITRAL VALVE               TRICUSPID VALVE MV Area (PHT): 5.02 cm    TR Peak grad:   47.9 mmHg MV Decel Time: 151 msec    TR Vmax:        346.00 cm/s MV E velocity: 91.90 cm/s MV A velocity: 77.10 cm/s  SHUNTS MV E/A ratio:  1.19        Systemic VTI:  0.16 m                            Systemic Diam: 1.80 cm Rachelle Hora Croitoru MD Electronically signed by Thurmon Fair MD Signature Date/Time: 03/01/2020/1:52:01 PM    Final     Review Of Systems Constitutional: No fever, chills, weight loss or gain. Eyes: No vision change, wears glasses. No discharge or pain. Ears: No hearing loss, No tinnitus. Respiratory: No asthma, COPD, pneumonias. Positive shortness of breath. No hemoptysis. Cardiovascular: No chest pain, palpitation, positive leg edema. Gastrointestinal: No nausea, vomiting, diarrhea, constipation. No GI bleed. No hepatitis. Genitourinary: No dysuria, hematuria, kidney stone. No  incontinance. Neurological: Positive headache, no stroke, seizures.  Psychiatry: No psych facility admission for anxiety, depression, suicide. No detox. Skin: No rash. Musculoskeletal: Positive joint pain, fibromyalgia, neck pain, back pain. Lymphadenopathy: No lymphadenopathy. Hematology: No anemia or easy bruising.   Blood  pressure (!) 200/136, pulse 94, temperature 97.7 F (36.5 C), temperature source Oral, resp. rate 18, height 5\' 5"  (1.651 m), weight 111.7 kg, SpO2 100 %. Body mass index is 40.99 kg/m. General appearance: alert, cooperative, appears stated age and mild respiratory  distress Head: Normocephalic, atraumatic. Eyes: Brown eyes, pink conjunctiva, corneas clear. PERRL, EOM's intact. Neck: No adenopathy, no carotid bruit, no JVD, supple, symmetrical, trachea midline and thyroid not enlarged. Resp: Clear to auscultation bilaterally. Cardio: Regular rate and rhythm, S1, S2 normal, II/VI systolic murmur, no click, rub or gallop GI: Soft, non-tender; bowel sounds normal; no organomegaly. Extremities: 1 + edema, no cyanosis or clubbing. Skin: Warm and dry.  Neurologic: Alert and oriented X 3, normal strength. Normal coordination and gait.  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 Also start Coreg for dilated cardiomyopathy,  Isosorbide, hydralazine for blood pressure control in african . Offered cardiac catheterization to r/o CAD but the patient needs more time to think about it and currently refuses.  Time spent: Review of old records, Lab, x-rays, EKG, other cardiac tests, examination, discussion with patient and physician over 70 minutes.  [place], MD  03/01/2020, 6:45 PM

## 2020-03-01 NOTE — Progress Notes (Signed)
  Echocardiogram 2D Echocardiogram has been performed.  Delcie Roch 03/01/2020, 10:50 AM

## 2020-03-01 NOTE — ED Notes (Signed)
Report from [place], [place]

## 2020-03-01 NOTE — ED Notes (Signed)
Pt gone for echo 

## 2020-03-01 NOTE — Progress Notes (Signed)
PROGRESS NOTE    Susan Hays  BJS:[id number] DOB: [birth year] DOA: 02/29/2020 PCP: Patient, No Pcp Per   Brief Narrative: Susan Hays is a 49 y.o. female with medical history significant for hypertension and BMI 38. Patient presented secondary to headaches and hypertension found to have hypertensive crisis. Started on blood pressure medications.   Assessment & Plan:   Principal Problem:   Hypertensive crisis Active Problems:   Elevated troponin   Chronic combined systolic (congestive) and diastolic (congestive) heart failure (HCC)   Hypertensive crisis Associated headaches. Secondary to medication non-adherence which appears to be secondary to patient not having good follow-up with her PCP. Patient started on home meds with no significant improvement. Improved with IV labetalol. Transthoracic Echocardiogram significant for  -Increase to amlodipine 10 mg daily and continue hydrochlorothiazide 12.5 mg -In light of newly diagnosed cardiomyopathy, will add losartan 50 mg daily -Cardiology consulted, awaiting recommendations  Elevated troponin Associated with hypertensive crisis. No chest pain or EKG with ischemic findings. Troponin peak of 169 and last troponin of 138. -Cardiology recommendations  Combined systolic and diastolic heart failure, likely chronic Right-sided heart failure Biatrial enlargement New diagnosis. EF of 25-30% with grade 2 diastolic dysfunction, global hypokinesis. Right ventricle is also enlarged with reduced function. Biatrial enlargement. Does not appear to be significantly overloaded but does have elevated BNP and LE swelling.  -Avoid beta-blocker for now; avoid CCB (non-dihydropyridine) -Losartan as above  Headaches Improved. Likely secondary to hypertension.   DVT prophylaxis: Lovenox Code Status:   Code Status: Full Code Family Communication: None at bedside Disposition Plan: Discharge home in 1-3 days pending cardiology  workup/management   Consultants:   Cardiology, Dr. Algie Coffer  Procedures:   TRANSTHORACIC ECHOCARDIOGRAM (03/01/2020) IMPRESSIONS    1. Left ventricular ejection fraction, by estimation, is 25 to 30%. The  left ventricle has severely decreased function. The left ventricle  demonstrates global hypokinesis. Left ventricular diastolic parameters are  consistent with Grade II diastolic  dysfunction (pseudonormalization). Elevated left atrial pressure. There is  the interventricular septum is flattened in diastole ('D' shaped left  ventricle), consistent with right ventricular volume overload.  2. Right ventricular systolic function is moderately reduced. The right  ventricular size is mildly enlarged. There is severely elevated pulmonary  artery systolic pressure.  3. Left atrial size was moderately dilated.  4. Right atrial size was moderately dilated.  5. Marked variation in flow velocities across the cardiac valves,  apparently with a respiratory pattern, raises concern for increased  ventricular interdependence (consider constrictive physiology).  6. The mitral valve is normal in structure. Mild to moderate mitral valve  regurgitation. No evidence of mitral stenosis.  7. Tricuspid valve regurgitation is severe.  8. The aortic valve is normal in structure. Aortic valve regurgitation is  not visualized. No aortic stenosis is present.  9. The inferior vena cava is dilated in size with <50% respiratory  variability, suggesting right atrial pressure of 15 mmHg.  Antimicrobials:  None    Subjective: No chest pain or dyspnea. States she generally does not have swelling.  Objective: Vitals:   03/01/20 1100 03/01/20 1200 03/01/20 1245 03/01/20 1404  BP: (!) 214/131 (!) 173/135 (!) 216/135 (!) 172/110  Pulse: 91 89 87   Resp: (!) 24 (!) 25 (!) 21   Temp:      TempSrc:      SpO2: 98% 98% 99%   Weight:      Height:        Intake/Output Summary (Last  24 hours) at  03/01/2020 1507 Last data filed at 03/01/2020 0811 Gross per 24 hour  Intake 3 ml  Output --  Net 3 ml   Filed Weights   02/29/20 1138  Weight: 104.3 kg    Examination:  General exam: Appears calm and comfortable Respiratory system: Clear to auscultation. Respiratory effort normal. Cardiovascular system: S1 & S2 heard, RRR. No murmurs, rubs, gallops or clicks. Gastrointestinal system: Abdomen is nondistended, soft and nontender. No organomegaly or masses felt. Normal bowel sounds heard. Central nervous system: Alert and oriented. No focal neurological deficits. Musculoskeletal: 1+ BLE pitting edema. No calf tenderness Skin: No cyanosis. No rashes Psychiatry: Judgement and insight appear normal. Anxious.     Data Reviewed: I have personally reviewed following labs and imaging studies  CBC Lab Results  Component Value Date   WBC 8.3 03/01/2020   RBC 4.19 03/01/2020   HGB 10.8 (L) 03/01/2020   HCT 37.1 03/01/2020   MCV 88.5 03/01/2020   MCH 25.8 (L) 03/01/2020   PLT 273 03/01/2020   MCHC 29.1 (L) 03/01/2020   RDW 15.0 03/01/2020   LYMPHSABS 1.9 07/25/2007   MONOABS 0.7 07/25/2007   EOSABS 0.1 07/25/2007   BASOSABS 0.0 07/25/2007     Last metabolic panel Lab Results  Component Value Date   NA 140 03/01/2020   K 3.6 03/01/2020   CL 103 03/01/2020   CO2 27 03/01/2020   BUN 12 03/01/2020   CREATININE 1.08 (H) 03/01/2020   GLUCOSE 140 (H) 03/01/2020   GFRNONAA 60 (L) 03/01/2020   GFRAA >60 03/01/2020   CALCIUM 8.1 (L) 03/01/2020   ANIONGAP 10 03/01/2020    CBG (last 3)  No results for input(s): GLUCAP in the last 72 hours.   GFR: Estimated Creatinine Clearance: 74.7 mL/min (A) (by C-G formula based on SCr of 1.08 mg/dL (H)).  Coagulation Profile: No results for input(s): INR, PROTIME in the last 168 hours.  Recent Results (from the past 240 hour(s))  SARS Coronavirus 2 by RT PCR (hospital order, performed in [facility] hospital lab) Nasopharyngeal  Nasopharyngeal Swab     Status: None   Collection Time: 02/29/20  9:45 PM   Specimen: Nasopharyngeal Swab  Result Value Ref Range Status   SARS Coronavirus 2 NEGATIVE NEGATIVE Final    Comment: (NOTE) SARS-CoV-2 target nucleic acids are NOT DETECTED.  The SARS-CoV-2 RNA is generally detectable in upper and lower respiratory specimens during the acute phase of infection. The lowest concentration of SARS-CoV-2 viral copies this assay can detect is 250 copies / mL. A negative result does not preclude SARS-CoV-2 infection and should not be used as the sole basis for treatment or other patient management decisions.  A negative result may occur with improper specimen collection / handling, submission of specimen other than nasopharyngeal swab, presence of viral mutation(s) within the areas targeted by this assay, and inadequate number of viral copies (<250 copies / mL). A negative result must be combined with clinical observations, patient history, and epidemiological information.  Fact Sheet for Patients:   BoilerBrush.com.cy  Fact Sheet for Healthcare Providers: https://pope.com/  This test is not yet approved or  cleared by the [place] FDA and has been authorized for detection and/or diagnosis of SARS-CoV-2 by FDA under an Emergency Use Authorization (EUA).  This EUA will remain in effect (meaning this test can be used) for the duration of the COVID-19 declaration under Section 564(b)(1) of the Act, 21 U.S.C. section 360bbb-3(b)(1), unless the authorization is terminated or  revoked sooner.  Performed at [facility], [address] [place], [place] [address]         Radiology Studies: DG Chest 2 View  Result Date: 02/29/2020 CLINICAL DATA:  Malignant hypertension EXAM: CHEST - 2 VIEW COMPARISON:  07/25/2007 FINDINGS: Lung volumes are small, but are symmetric. Minimal bibasilar atelectasis. No pneumothorax or pleural  effusion. Mild cardiomegaly has developed, new since prior examination. The pulmonary vascularity is normal. No acute bone abnormality. IMPRESSION: Interval development of mild cardiomegaly. Electronically Signed   By: Helyn NumbersAshesh  Parikh MD   On: 02/29/2020 21:56   ECHOCARDIOGRAM COMPLETE  Result Date: 03/01/2020    ECHOCARDIOGRAM REPORT   Patient Name:   Sharrie RothmanDORIS J Rockwood Date of Exam: 03/01/2020 Medical Rec #:  [id number]        Height:       65.0 in Accession #:    [id number]       Weight:       230.0 lb Date of Birth:  01/26/1971         BSA:          2.099 m Patient Age:    49 years         BP:           126/83 mmHg Patient Gender: F                HR:           99 bpm. Exam Location:  Inpatient Procedure: 2D Echo Indications:    elevated troponin  History:        Patient has no prior history of Echocardiogram examinations.                 Risk Factors:Hypertension.  Sonographer:    Delcie RochLauren Pennington Referring Phys: [id number] TIMOTHY S OPYD IMPRESSIONS  1. Left ventricular ejection fraction, by estimation, is 25 to 30%. The left ventricle has severely decreased function. The left ventricle demonstrates global hypokinesis. Left ventricular diastolic parameters are consistent with Grade II diastolic dysfunction (pseudonormalization). Elevated left atrial pressure. There is the interventricular septum is flattened in diastole ('D' shaped left ventricle), consistent with right ventricular volume overload.  2. Right ventricular systolic function is moderately reduced. The right ventricular size is mildly enlarged. There is severely elevated pulmonary artery systolic pressure.  3. Left atrial size was moderately dilated.  4. Right atrial size was moderately dilated.  5. Marked variation in flow velocities across the cardiac valves, apparently with a respiratory pattern, raises concern for increased ventricular interdependence (consider constrictive physiology).  6. The mitral valve is normal in structure. Mild to moderate  mitral valve regurgitation. No evidence of mitral stenosis.  7. Tricuspid valve regurgitation is severe.  8. The aortic valve is normal in structure. Aortic valve regurgitation is not visualized. No aortic stenosis is present.  9. The inferior vena cava is dilated in size with <50% respiratory variability, suggesting right atrial pressure of 15 mmHg. FINDINGS  Left Ventricle: Left ventricular ejection fraction, by estimation, is 25 to 30%. The left ventricle has severely decreased function. The left ventricle demonstrates global hypokinesis. The left ventricular internal cavity size was normal in size. There is no left ventricular hypertrophy. The interventricular septum is flattened in diastole ('D' shaped left ventricle), consistent with right ventricular volume overload. Left ventricular diastolic parameters are consistent with Grade II diastolic dysfunction (pseudonormalization). Elevated left atrial pressure. Right Ventricle: The right ventricular size is mildly enlarged. No increase in right ventricular wall  thickness. Right ventricular systolic function is moderately reduced. There is severely elevated pulmonary artery systolic pressure. The tricuspid regurgitant velocity is 3.46 m/s, and with an assumed right atrial pressure of 15 mmHg, the estimated right ventricular systolic pressure is 62.9 mmHg. Left Atrium: Left atrial size was moderately dilated. Right Atrium: Right atrial size was moderately dilated. Pericardium: Marked variation in flow velocities across the cardiac valves, apparently with a respiratory pattern, raises concern for increased ventricular interdependence (consider constrictive physiology). There is no evidence of pericardial effusion. Mitral Valve: The mitral valve is normal in structure. Mild to moderate mitral valve regurgitation, with centrally-directed jet. No evidence of mitral valve stenosis. Tricuspid Valve: The tricuspid valve is grossly normal. Tricuspid valve regurgitation is  severe. No evidence of tricuspid stenosis. Aortic Valve: The aortic valve is normal in structure. Aortic valve regurgitation is not visualized. No aortic stenosis is present. Pulmonic Valve: The pulmonic valve was normal in structure. Pulmonic valve regurgitation is not visualized. No evidence of pulmonic stenosis. Aorta: The aortic root is normal in size and structure. Venous: The inferior vena cava is dilated in size with less than 50% respiratory variability, suggesting right atrial pressure of 15 mmHg. IAS/Shunts: No atrial level shunt detected by color flow Doppler.  LEFT VENTRICLE PLAX 2D LVIDd:         4.50 cm LVIDs:         3.80 cm LV PW:         1.10 cm LV IVS:        1.10 cm LVOT diam:     1.80 cm LV SV:         41 LV SV Index:   20 LVOT Area:     2.54 cm  RIGHT VENTRICLE            IVC RV S prime:     8.49 cm/s  IVC diam: 2.10 cm TAPSE (M-mode): 1.5 cm LEFT ATRIUM             Index       RIGHT ATRIUM           Index LA Vol (A2C):   79.9 ml 38.06 ml/m RA Area:     22.80 cm LA Vol (A4C):   71.7 ml 34.16 ml/m RA Volume:   72.40 ml  34.49 ml/m LA Biplane Vol: 78.0 ml 37.16 ml/m  AORTIC VALVE LVOT Vmax:   107.00 cm/s LVOT Vmean:  69.600 cm/s LVOT VTI:    0.161 m  AORTA Ao Root diam: 2.90 cm Ao Asc diam:  3.10 cm MITRAL VALVE               TRICUSPID VALVE MV Area (PHT): 5.02 cm    TR Peak grad:   47.9 mmHg MV Decel Time: 151 msec    TR Vmax:        346.00 cm/s MV E velocity: 91.90 cm/s MV A velocity: 77.10 cm/s  SHUNTS MV E/A ratio:  1.19        Systemic VTI:  0.16 m                            Systemic Diam: 1.80 cm Mihai Croitoru MD Electronically signed by Thurmon Fair MD Signature Date/Time: 03/01/2020/1:52:01 PM    Final         Scheduled Meds: . enoxaparin (LOVENOX) injection  40 mg Subcutaneous Q24H  . hydrochlorothiazide  12.5 mg Oral Daily  . sodium chloride  flush  3 mL Intravenous Q12H  . sodium chloride flush  3 mL Intravenous Q12H   Continuous Infusions: . sodium chloride        LOS: 0 days     Jacquelin Hawking, MD [facility] 03/01/2020, 3:07 PM  If 7PM-7AM, please contact night-coverage [email or web address]

## 2020-03-02 ENCOUNTER — Inpatient Hospital Stay (HOSPITAL_COMMUNITY): Payer: Medicaid Other

## 2020-03-02 DIAGNOSIS — I517 Cardiomegaly: Secondary | ICD-10-CM

## 2020-03-02 DIAGNOSIS — I50812 Chronic right heart failure: Secondary | ICD-10-CM

## 2020-03-02 LAB — BASIC METABOLIC PANEL
Anion gap: 12 (ref 5–15)
BUN: 14 mg/dL (ref 6–20)
CO2: 27 mmol/L (ref 22–32)
Calcium: 8.4 mg/dL — ABNORMAL LOW (ref 8.9–10.3)
Chloride: 97 mmol/L — ABNORMAL LOW (ref 98–111)
Creatinine, Ser: 0.99 mg/dL (ref 0.44–1.00)
GFR calc Af Amer: >60 mL/min (ref >60)
GFR calc non Af Amer: >60 mL/min (ref >60)
Glucose, Bld: 127 mg/dL — ABNORMAL HIGH (ref 70–99)
Potassium: 4.1 mmol/L (ref 3.5–5.1)
Sodium: 136 mmol/L (ref 135–145)

## 2020-03-02 LAB — CBC
HCT: 37.1 % (ref 36.0–46.0)
Hemoglobin: 11.2 g/dL — ABNORMAL LOW (ref 12.0–15.0)
MCH: 26 pg (ref 26.0–34.0)
MCHC: 30.2 g/dL (ref 30.0–36.0)
MCV: 86.3 fL (ref 80.0–100.0)
Platelets: 324 10*3/uL (ref 150–400)
RBC: 4.3 MIL/uL (ref 3.87–5.11)
RDW: 14.8 % (ref 11.5–15.5)
WBC: 10.3 10*3/uL (ref 4.0–10.5)
nRBC: 0 % (ref 0.0–0.2)

## 2020-03-02 MED ORDER — TECHNETIUM TC 99M TETROFOSMIN IV KIT
10.0000 | PACK | Freq: Once | INTRAVENOUS | Status: AC | PRN
  Administered 2020-03-02: 10 via INTRAVENOUS

## 2020-03-02 MED ORDER — REGADENOSON 0.4 MG/5ML IV SOLN
INTRAVENOUS | Status: AC
  Administered 2020-03-02: 0.4 mg via INTRAVENOUS
  Filled 2020-03-02: qty 5

## 2020-03-02 MED ORDER — HYDRALAZINE HCL 50 MG PO TABS
50.0000 mg | ORAL_TABLET | Freq: Three times a day (TID) | ORAL | Status: DC
  Administered 2020-03-02 – 2020-03-03 (×2): 50 mg via ORAL
  Filled 2020-03-02 (×2): qty 1

## 2020-03-02 MED ORDER — REGADENOSON 0.4 MG/5ML IV SOLN
0.4000 mg | Freq: Once | INTRAVENOUS | Status: AC
  Filled 2020-03-02: qty 5

## 2020-03-02 MED ORDER — SPIRONOLACTONE 12.5 MG HALF TABLET
12.5000 mg | ORAL_TABLET | Freq: Every day | ORAL | Status: DC
  Administered 2020-03-02 – 2020-03-03 (×2): 12.5 mg via ORAL
  Filled 2020-03-02 (×2): qty 1

## 2020-03-02 MED ORDER — ISOSORBIDE MONONITRATE ER 60 MG PO TB24
60.0000 mg | ORAL_TABLET | Freq: Every day | ORAL | Status: DC
  Administered 2020-03-03 – 2020-03-04 (×2): 60 mg via ORAL
  Filled 2020-03-02 (×2): qty 1

## 2020-03-02 MED ORDER — TECHNETIUM TC 99M TETROFOSMIN IV KIT
30.0000 | PACK | Freq: Once | INTRAVENOUS | Status: AC | PRN
  Administered 2020-03-02: 30 via INTRAVENOUS

## 2020-03-02 MED ORDER — SACUBITRIL-VALSARTAN 49-51 MG PO TABS
1.0000 | ORAL_TABLET | Freq: Two times a day (BID) | ORAL | Status: DC
  Administered 2020-03-02 – 2020-03-04 (×4): 1 via ORAL
  Filled 2020-03-02 (×4): qty 1

## 2020-03-02 NOTE — Consult Note (Addendum)
Ref: Patient, No Pcp Per   Subjective:  No reversible ischemia on nuclear stress test.  No chest pain. Hypertension remains uncontrolled.  Discussed diet, medications and activity. Will check S. Metanephrine if BP does not improve.  She is on Entresto, Carvedilol, Amlodipine, Hydralazine and isosorbide.  Objective:  Vital Signs in the last 24 hours: Temp:  [98 F (36.7 C)-99.5 F (37.5 C)] 98 F (36.7 C) (09/22 1424) Pulse Rate:  [81-96] 92 (09/22 1424) Cardiac Rhythm: Normal sinus rhythm (09/22 0700) Resp:  [16-20] 18 (09/22 1100) BP: (139-258)/(84-137) 216/107 (09/22 1424) SpO2:  [97 %-100 %] 100 % (09/22 1424) Weight:  [110.1 kg] 110.1 kg (09/22 0512)  Physical Exam: BP Readings from Last 1 Encounters:  03/02/20 (!) 216/107     Wt Readings from Last 1 Encounters:  03/02/20 110.1 kg    Weight change: 7.394 kg Body mass index is 40.4 kg/m. HEENT: [place]/AT, Eyes-Brown, PERL, EOMI, Conjunctiva-Pink, Sclera-Non-icteric Neck: No JVD, No bruit, Trachea midline. Lungs:  Clear, Bilateral. Cardiac:  Regular rhythm, normal S1 and S2, no S3. III/VI systolic murmur. Abdomen:  Soft, non-tender. BS present. Extremities:  2 + edema present. No cyanosis. No clubbing. CNS: AxOx3, Cranial nerves grossly intact, moves all 4 extremities.  Skin: Warm and dry.   Intake/Output from previous day: 09/21 0701 - 09/22 0700 In: 6 [I.V.:6] Out: 350 [Urine:350]    Lab Results: BMET    Component Value Date/Time   NA 136 03/02/2020 0541   NA 140 03/01/2020 0348   NA 138 02/29/2020 1214   K 4.1 03/02/2020 0541   K 3.6 03/01/2020 0348   K 3.9 02/29/2020 1214   CL 97 (L) 03/02/2020 0541   CL 103 03/01/2020 0348   CL 101 02/29/2020 1214   CO2 27 03/02/2020 0541   CO2 27 03/01/2020 0348   CO2 24 02/29/2020 1214   GLUCOSE 127 (H) 03/02/2020 0541   GLUCOSE 140 (H) 03/01/2020 0348   GLUCOSE 144 (H) 02/29/2020 1214   BUN 14 03/02/2020 0541   BUN 12 03/01/2020 0348   BUN 18 02/29/2020 1214    CREATININE 0.99 03/02/2020 0541   CREATININE 1.08 (H) 03/01/2020 0348   CREATININE 1.02 (H) 02/29/2020 1214   CALCIUM 8.4 (L) 03/02/2020 0541   CALCIUM 8.1 (L) 03/01/2020 0348   CALCIUM 8.1 (L) 02/29/2020 1214   GFRNONAA >60 03/02/2020 0541   GFRNONAA 60 (L) 03/01/2020 0348   GFRNONAA >60 02/29/2020 1214   GFRAA >60 03/02/2020 0541   GFRAA >60 03/01/2020 0348   GFRAA >60 02/29/2020 1214   CBC    Component Value Date/Time   WBC 10.3 03/02/2020 1446   RBC 4.30 03/02/2020 1446   HGB 11.2 (L) 03/02/2020 1446   HCT 37.1 03/02/2020 1446   PLT 324 03/02/2020 1446   MCV 86.3 03/02/2020 1446   MCH 26.0 03/02/2020 1446   MCHC 30.2 03/02/2020 1446   RDW 14.8 03/02/2020 1446   LYMPHSABS 1.9 07/25/2007 1645   MONOABS 0.7 07/25/2007 1645   EOSABS 0.1 07/25/2007 1645   BASOSABS 0.0 07/25/2007 1645   HEPATIC Function Panel No results for input(s): PROT in the last 8760 hours.  Invalid input(s):  ALBUMIN,  AST,  ALT,  ALKPHOS,  BILIDIR,  IBILI HEMOGLOBIN A1C No components found for: HGA1C,  MPG CARDIAC ENZYMES No results found for: CKTOTAL, CKMB, CKMBINDEX, TROPONINI BNP No results for input(s): PROBNP in the last 8760 hours. TSH No results for input(s): TSH in the last 8760 hours. CHOLESTEROL No results for  input(s): CHOL in the last 8760 hours.  Scheduled Meds: . amLODipine  5 mg Oral BID  . carvedilol  6.25 mg Oral BID WC  . enoxaparin (LOVENOX) injection  40 mg Subcutaneous Q24H  . hydrALAZINE  50 mg Oral Q8H  . hydrochlorothiazide  12.5 mg Oral Daily  . [START ON 03/03/2020] isosorbide mononitrate  60 mg Oral Daily  . sacubitril-valsartan  1 tablet Oral BID  . sodium chloride flush  3 mL Intravenous Q12H  . sodium chloride flush  3 mL Intravenous Q12H   Continuous Infusions: . sodium chloride     PRN Meds:.sodium chloride, acetaminophen **OR** acetaminophen, hydrALAZINE, HYDROcodone-acetaminophen, ondansetron **OR** ondansetron (ZOFRAN) IV, polyethylene glycol, sodium  chloride flush  Assessment/Plan: Hypertensive crisis Combined systolic and diastolic left heart failure Moderate MR Severe TR Moderate to severe pulmonary hypertension Obesity Headache  Agree with Entresto use. Increase hydralazine dose. Add spironolactone.   LOS: 1 day   Time spent including chart review, lab review, examination, discussion with patient and nurse : 30 min   Orpah Cobb  MD  03/02/2020, 6:04 PM

## 2020-03-02 NOTE — Progress Notes (Signed)
Heart Failure Stewardship Pharmacist Progress Note   PCP: Patient, No Pcp Per PCP-Cardiologist: No primary care provider on file.    HPI:  49 yo F with PMH of HTN. She presented to the ED on 02/29/20 with recurrent headaches and hypertensive urgency. An ECHO was done on 03/01/20 and found to have LVEF of 25-30% and moderate RV dysfunction.  Current HF Medications: Carvedilol 6.25 mg BID Losartan 50 mg daily  Hydralazine 25 mg q6h Imdur 30 mg daily  Prior to admission HF Medications: None - no meds within the last 6 months  Pertinent Lab Values: . Serum creatinine 0.99, BUN 14, Potassium 4.1, Sodium 136, BNP 963.1  Vital Signs: . Weight: 242 lbs (admission weight: 246 lbs) . Blood pressure: 140-200/90s  . Heart rate: 80-90s   Medication Assistance / Insurance Benefits Check: Does the patient have prescription insurance?  Yes Type of insurance plan: Medicaid  Outpatient Pharmacy:  Prior to admission outpatient pharmacy: [facility] Is the patient willing to use [facility] pharmacy at discharge? Pending Is the patient willing to transition their outpatient pharmacy to utilize a [facility] outpatient pharmacy?   Pending    Assessment: 1. Acute systolic CHF (EF 86-76%), likely due to uncontrolled HTN. Patient deciding on if she would like to pursue cardiac cath for ischemic evaluation. NYHA class II symptoms. - Continue carvedilol 6.25 mg BID - Continue losartan 50 mg daily. Consider switching to [facility] for HFrEF and further BP reduction - Continue hydralazine 25 mg q6h and Imdur 30 mg daily - Would like to titrate up on other HF meds in attempts to remove non-HF anti-HTN medications (amlodipine, hydrochlorothiazide) - Consider adding spironolactone and/or SGLT2i prior to discharge  Plan: 1) Medication changes recommended at this time: - Stop losartan - Start Entresto 49/51 mg BID  2)  Education  - To be completed prior to discharge  Sharen Hones, PharmD, BCPS Heart  Failure Stewardship Pharmacist Phone [phone]

## 2020-03-02 NOTE — Plan of Care (Signed)
?  Problem: Coping: ?Goal: Level of anxiety will decrease ?Outcome: Progressing ?  ?Problem: Safety: ?Goal: Ability to remain free from injury will improve ?Outcome: Progressing ?  ?

## 2020-03-02 NOTE — Progress Notes (Signed)
PROGRESS NOTE  Susan Hays TDS:[id number] DOB: 1971-01-16 DOA: 02/29/2020 PCP: Patient, No Pcp Per   LOS: 1 day   Brief narrative: As per HPI,  Susan Hays is a 49 y.o. femalewith medical history significant forhypertension and obesity,  presented to the hospital secondary to headaches and hypertension found to have hypertensive crisis. She was then started on blood pressure medications and cardiology was consulted for further management.  Of note, patient did have a run out of her medications for 6 months and was in hypertensive crisis with heart failure.  Cardiogram showed LV ejection fraction of 25 to 30%.  Assessment/Plan:  Principal Problem:   Hypertensive crisis Active Problems:   Elevated troponin   Chronic combined systolic (congestive) and diastolic (congestive) heart failure (HCC)   Chronic right-sided CHF (congestive heart failure) (HCC)   Biatrial enlargement   Accelerated hypertension secondary to medication noncompliance.  Patient has been restarted on medications with improvement in blood pressure.  Initial blood pressure was systolic more than 200.  Currently on amlodipine, hydrochlorothiazide, hydralazine, Coreg, losartan.  Acute on likely chronic combined systolic and diastolic heart failure.  New diagnosis.  2D echocardiogram shows moderate mitral regurgitation severe TR and moderate to severe pulmonary hypertension with LV Ejection fraction of 25 to 30% with grade 2 diastolic dysfunction and global hypokinesis.  Likely secondary to accelerated hypertension.  Patient has been started on Coreg, isosorbide hydralazine.  On amlodipine and hydrochlorothiazide as well.  Patient has been seen by cardiology and recommended a stress test for further evaluation.  Will discontinue losartan and start the patient on Entresto.  Elevated troponins likely secondary to hypertensive crisis.  Cardiology on board.  Patient underwent stress testing today.  Morbid obesity.  Would  benefit significantly with weight loss.  DVT prophylaxis: enoxaparin (LOVENOX) injection 40 mg Start: 02/29/20 2145   Code Status: Full code  Family Communication: None  Status is: Inpatient  Remains inpatient appropriate because:Inpatient level of care appropriate due to severity of illness and Decompensated heart failure, accelerated hypertension   Dispo: The patient is from: Home              Anticipated d/c is to: Home              Anticipated d/c date is: 2 days              Patient currently is not medically stable to d/c.  Consultants:  Cardiology  Procedures:  Stress test  Antibiotics:   None  Anti-infectives (From admission, onward)   None     Subjective: Today, patient was seen and examined at bedside.  Complains of feeling hungry.  Seen after nuclear stress test.  Denies any chest pain or shortness of breath, dizziness, tiredness or headache  Objective: Vitals:   03/02/20 1100 03/02/20 1424  BP: (!) 166/126 (!) 216/107  Pulse: 93 92  Resp: 18   Temp:  98 F (36.7 C)  SpO2:  100%    Intake/Output Summary (Last 24 hours) at 03/02/2020 1534 Last data filed at 03/02/2020 0108 Gross per 24 hour  Intake 3 ml  Output 350 ml  Net -347 ml   Filed Weights   02/29/20 1138 03/01/20 1654 03/02/20 0512  Weight: 104.3 kg 111.7 kg 110.1 kg   Body mass index is 40.4 kg/m.   Physical Exam: GENERAL: Patient is alert awake and oriented. Not in obvious distress.  Morbidly obese HENT: No scleral pallor or icterus. Pupils equally reactive to light. Oral mucosa  is moist NECK: is supple, no gross swelling noted. CHEST: Clear to auscultation. No crackles or wheezes.  Diminished breath sounds bilaterally. CVS: S1 and S2 heard, no murmur. Regular rate and rhythm.  ABDOMEN: Soft, non-tender, bowel sounds are present. EXTREMITIES: Bilateral lower extremity and thigh edema. CNS: Cranial nerves are intact. No focal motor deficits. SKIN: warm and dry without  rashes.  Data Review: I have personally reviewed the following laboratory data and studies,  CBC: Recent Labs  Lab 02/29/20 1214 03/01/20 0348  WBC 10.6* 8.3  HGB 11.1* 10.8*  HCT 37.8 37.1  MCV 88.5 88.5  PLT 317 273   Basic Metabolic Panel: Recent Labs  Lab 02/29/20 1214 03/01/20 0348 03/02/20 0541  NA 138 140 136  K 3.9 3.6 4.1  CL 101 103 97*  CO2 24 27 27   GLUCOSE 144* 140* 127*  BUN 18 12 14   CREATININE 1.02* 1.08* 0.99  CALCIUM 8.1* 8.1* 8.4*   Liver Function Tests: No results for input(s): AST, ALT, ALKPHOS, BILITOT, PROT, ALBUMIN in the last 168 hours. No results for input(s): LIPASE, AMYLASE in the last 168 hours. No results for input(s): AMMONIA in the last 168 hours. Cardiac Enzymes: No results for input(s): CKTOTAL, CKMB, CKMBINDEX, TROPONINI in the last 168 hours. BNP (last 3 results) Recent Labs    03/01/20 0812  BNP 963.1*    ProBNP (last 3 results) No results for input(s): PROBNP in the last 8760 hours.  CBG: No results for input(s): GLUCAP in the last 168 hours. Recent Results (from the past 240 hour(s))  SARS Coronavirus 2 by RT PCR (hospital order, [facility]) Nasopharyngeal Nasopharyngeal Swab     Status: None   Collection Time: 02/29/20  9:45 PM   Specimen: Nasopharyngeal Swab  Result Value Ref Range Status   SARS Coronavirus 2 NEGATIVE NEGATIVE Final    Comment: (NOTE) SARS-CoV-2 target nucleic acids are NOT DETECTED.  The SARS-CoV-2 RNA is generally detectable in upper and lower respiratory specimens during the acute phase of infection. The lowest concentration of SARS-CoV-2 viral copies this assay can detect is 250 copies / mL. A negative result does not preclude SARS-CoV-2 infection and should not be used as the sole basis for treatment or other patient management decisions.  A negative result may occur with improper specimen collection / handling, submission of specimen other than nasopharyngeal  swab, presence of viral mutation(s) within the areas targeted by this assay, and inadequate number of viral copies (<250 copies / mL). A negative result must be combined with clinical observations, patient history, and epidemiological information.  Fact Sheet for Patients:   BoilerBrush.com.cyhttps://www.fda.gov/media/136312/download  Fact Sheet for Healthcare Providers: https://pope.com/https://www.fda.gov/media/136313/download  This test is not yet approved or  cleared by the [place] FDA and has been authorized for detection and/or diagnosis of SARS-CoV-2 by FDA under an Emergency Use Authorization (EUA).  This EUA will remain in effect (meaning this test can be used) for the duration of the COVID-19 declaration under Section 564(b)(1) of the Act, 21 U.S.C. section 360bbb-3(b)(1), unless the authorization is terminated or revoked sooner.  Performed at [facility], [address] [place], [place] [address]      Studies: DG Chest 2 View  Result Date: 02/29/2020 CLINICAL DATA:  Malignant hypertension EXAM: CHEST - 2 VIEW COMPARISON:  07/25/2007 FINDINGS: Lung volumes are small, but are symmetric. Minimal bibasilar atelectasis. No pneumothorax or pleural effusion. Mild cardiomegaly has developed, new since prior examination. The pulmonary vascularity is  normal. No acute bone abnormality. IMPRESSION: Interval development of mild cardiomegaly. Electronically Signed   By: Helyn Numbers MD   On: 02/29/2020 21:56   NM Myocar Multi W/Spect Izetta Dakin Motion / EF  Result Date: 03/02/2020 CLINICAL DATA:  Dilated cardiomyopathy. EXAM: MYOCARDIAL IMAGING WITH SPECT (REST AND PHARMACOLOGIC-STRESS) GATED LEFT VENTRICULAR WALL MOTION STUDY LEFT VENTRICULAR EJECTION FRACTION TECHNIQUE: Standard myocardial SPECT imaging was performed after resting intravenous injection of 10 mCi Tc-73m tetrofosmin. Subsequently, intravenous infusion of Lexiscan was performed under the supervision of the Cardiology staff. At peak effect of the  drug, 30 mCi Tc-50m tetrofosmin was injected intravenously and standard myocardial SPECT imaging was performed. Quantitative gated imaging was also performed to evaluate left ventricular wall motion, and estimate left ventricular ejection fraction. COMPARISON:  None. FINDINGS: Perfusion: No decreased activity in the left ventricle on stress imaging to suggest reversible ischemia or infarction. Wall Motion: Mild global hypomotility is noted. Left Ventricular Ejection Fraction: 44 % End diastolic volume 128 ml End systolic volume 72 ml IMPRESSION: 1. No reversible ischemia or infarction. 2. Mild global hypomotility is noted. 3. Left ventricular ejection fraction 44% 4. Non invasive risk stratification*: Intermediate *2012 Appropriate Use Criteria for Coronary Revascularization Focused Update: J Am Coll Cardiol. 2012;59(9):857-881. http://content.dementiazones.com.aspx?articleid=1201161 Electronically Signed   By: Lupita Raider M.D.   On: 03/02/2020 14:30   ECHOCARDIOGRAM COMPLETE  Result Date: 03/01/2020    ECHOCARDIOGRAM REPORT   Patient Name:   Susan Hays Date of Exam: 03/01/2020 Medical Rec #:  [id number]        Height:       65.0 in Accession #:    [id number]       Weight:       230.0 lb Date of Birth:  26-Feb-1971         BSA:          2.099 m Patient Age:    49 years         BP:           126/83 mmHg Patient Gender: F                HR:           99 bpm. Exam Location:  Inpatient Procedure: 2D Echo Indications:    elevated troponin  History:        Patient has no prior history of Echocardiogram examinations.                 Risk Factors:Hypertension.  Sonographer:    Delcie Roch Referring Phys: [id number] TIMOTHY S OPYD IMPRESSIONS  1. Left ventricular ejection fraction, by estimation, is 25 to 30%. The left ventricle has severely decreased function. The left ventricle demonstrates global hypokinesis. Left ventricular diastolic parameters are consistent with Grade II diastolic dysfunction  (pseudonormalization). Elevated left atrial pressure. There is the interventricular septum is flattened in diastole ('D' shaped left ventricle), consistent with right ventricular volume overload.  2. Right ventricular systolic function is moderately reduced. The right ventricular size is mildly enlarged. There is severely elevated pulmonary artery systolic pressure.  3. Left atrial size was moderately dilated.  4. Right atrial size was moderately dilated.  5. Marked variation in flow velocities across the cardiac valves, apparently with a respiratory pattern, raises concern for increased ventricular interdependence (consider constrictive physiology).  6. The mitral valve is normal in structure. Mild to moderate mitral valve regurgitation. No evidence of mitral stenosis.  7. Tricuspid valve regurgitation is severe.  8. The  aortic valve is normal in structure. Aortic valve regurgitation is not visualized. No aortic stenosis is present.  9. The inferior vena cava is dilated in size with <50% respiratory variability, suggesting right atrial pressure of 15 mmHg. FINDINGS  Left Ventricle: Left ventricular ejection fraction, by estimation, is 25 to 30%. The left ventricle has severely decreased function. The left ventricle demonstrates global hypokinesis. The left ventricular internal cavity size was normal in size. There is no left ventricular hypertrophy. The interventricular septum is flattened in diastole ('D' shaped left ventricle), consistent with right ventricular volume overload. Left ventricular diastolic parameters are consistent with Grade II diastolic dysfunction (pseudonormalization). Elevated left atrial pressure. Right Ventricle: The right ventricular size is mildly enlarged. No increase in right ventricular wall thickness. Right ventricular systolic function is moderately reduced. There is severely elevated pulmonary artery systolic pressure. The tricuspid regurgitant velocity is 3.46 m/s, and with an  assumed right atrial pressure of 15 mmHg, the estimated right ventricular systolic pressure is 62.9 mmHg. Left Atrium: Left atrial size was moderately dilated. Right Atrium: Right atrial size was moderately dilated. Pericardium: Marked variation in flow velocities across the cardiac valves, apparently with a respiratory pattern, raises concern for increased ventricular interdependence (consider constrictive physiology). There is no evidence of pericardial effusion. Mitral Valve: The mitral valve is normal in structure. Mild to moderate mitral valve regurgitation, with centrally-directed jet. No evidence of mitral valve stenosis. Tricuspid Valve: The tricuspid valve is grossly normal. Tricuspid valve regurgitation is severe. No evidence of tricuspid stenosis. Aortic Valve: The aortic valve is normal in structure. Aortic valve regurgitation is not visualized. No aortic stenosis is present. Pulmonic Valve: The pulmonic valve was normal in structure. Pulmonic valve regurgitation is not visualized. No evidence of pulmonic stenosis. Aorta: The aortic root is normal in size and structure. Venous: The inferior vena cava is dilated in size with less than 50% respiratory variability, suggesting right atrial pressure of 15 mmHg. IAS/Shunts: No atrial level shunt detected by color flow Doppler.  LEFT VENTRICLE PLAX 2D LVIDd:         4.50 cm LVIDs:         3.80 cm LV PW:         1.10 cm LV IVS:        1.10 cm LVOT diam:     1.80 cm LV SV:         41 LV SV Index:   20 LVOT Area:     2.54 cm  RIGHT VENTRICLE            IVC RV S prime:     8.49 cm/s  IVC diam: 2.10 cm TAPSE (M-mode): 1.5 cm LEFT ATRIUM             Index       RIGHT ATRIUM           Index LA Vol (A2C):   79.9 ml 38.06 ml/m RA Area:     22.80 cm LA Vol (A4C):   71.7 ml 34.16 ml/m RA Volume:   72.40 ml  34.49 ml/m LA Biplane Vol: 78.0 ml 37.16 ml/m  AORTIC VALVE LVOT Vmax:   107.00 cm/s LVOT Vmean:  69.600 cm/s LVOT VTI:    0.161 m  AORTA Ao Root diam: 2.90 cm  Ao Asc diam:  3.10 cm MITRAL VALVE               TRICUSPID VALVE MV Area (PHT): 5.02 cm    TR Peak grad:  47.9 mmHg MV Decel Time: 151 msec    TR Vmax:        346.00 cm/s MV E velocity: 91.90 cm/s MV A velocity: 77.10 cm/s  SHUNTS MV E/A ratio:  1.19        Systemic VTI:  0.16 m                            Systemic Diam: 1.80 cm Rachelle Hora Croitoru MD Electronically signed by Thurmon Fair MD Signature Date/Time: 03/01/2020/1:52:01 PM    Final      Joycelyn Das, MD  [facility] 03/02/2020

## 2020-03-03 LAB — BASIC METABOLIC PANEL
Anion gap: 7 (ref 5–15)
BUN: 10 mg/dL (ref 6–20)
CO2: 28 mmol/L (ref 22–32)
Calcium: 8.1 mg/dL — ABNORMAL LOW (ref 8.9–10.3)
Chloride: 102 mmol/L (ref 98–111)
Creatinine, Ser: 0.98 mg/dL (ref 0.44–1.00)
GFR calc Af Amer: >60 mL/min (ref >60)
GFR calc non Af Amer: >60 mL/min (ref >60)
Glucose, Bld: 265 mg/dL — ABNORMAL HIGH (ref 70–99)
Potassium: 3.7 mmol/L (ref 3.5–5.1)
Sodium: 137 mmol/L (ref 135–145)

## 2020-03-03 LAB — CBC
HCT: 35.7 % — ABNORMAL LOW (ref 36.0–46.0)
Hemoglobin: 10.8 g/dL — ABNORMAL LOW (ref 12.0–15.0)
MCH: 25.7 pg — ABNORMAL LOW (ref 26.0–34.0)
MCHC: 30.3 g/dL (ref 30.0–36.0)
MCV: 85 fL (ref 80.0–100.0)
Platelets: 312 10*3/uL (ref 150–400)
RBC: 4.2 MIL/uL (ref 3.87–5.11)
RDW: 14.7 % (ref 11.5–15.5)
WBC: 9.3 10*3/uL (ref 4.0–10.5)
nRBC: 0 % (ref 0.0–0.2)

## 2020-03-03 LAB — PHOSPHORUS: Phosphorus: 2.7 mg/dL (ref 2.5–4.6)

## 2020-03-03 LAB — MAGNESIUM: Magnesium: 1.4 mg/dL — ABNORMAL LOW (ref 1.7–2.4)

## 2020-03-03 MED ORDER — SPIRONOLACTONE 25 MG PO TABS
25.0000 mg | ORAL_TABLET | Freq: Every day | ORAL | Status: DC
  Administered 2020-03-04: 25 mg via ORAL
  Filled 2020-03-03: qty 1

## 2020-03-03 MED ORDER — HYDRALAZINE HCL 50 MG PO TABS
50.0000 mg | ORAL_TABLET | Freq: Three times a day (TID) | ORAL | Status: DC
  Administered 2020-03-03 – 2020-03-04 (×3): 50 mg via ORAL
  Filled 2020-03-03 (×3): qty 1

## 2020-03-03 MED ORDER — MAGNESIUM SULFATE 2 GM/50ML IV SOLN
2.0000 g | Freq: Once | INTRAVENOUS | Status: AC
  Administered 2020-03-03: 2 g via INTRAVENOUS
  Filled 2020-03-03: qty 50

## 2020-03-03 MED ORDER — MAGNESIUM OXIDE 400 (241.3 MG) MG PO TABS
400.0000 mg | ORAL_TABLET | Freq: Two times a day (BID) | ORAL | Status: DC
  Administered 2020-03-03 – 2020-03-04 (×2): 400 mg via ORAL
  Filled 2020-03-03 (×2): qty 1

## 2020-03-03 MED ORDER — HYDRALAZINE HCL 50 MG PO TABS
75.0000 mg | ORAL_TABLET | Freq: Three times a day (TID) | ORAL | Status: DC
  Administered 2020-03-03: 75 mg via ORAL
  Filled 2020-03-03: qty 1

## 2020-03-03 NOTE — Progress Notes (Signed)
Heart Failure Stewardship Pharmacist Progress Note   PCP: Patient, No Pcp Per PCP-Cardiologist: No primary care provider on file.    HPI:  49 yo F with PMH of HTN. She presented to the ED on 02/29/20 with recurrent headaches and hypertensive urgency. An ECHO was done on 03/01/20 and found to have LVEF of 25-30% and moderate RV dysfunction.  Current HF Medications: Carvedilol 6.25 mg BID Entresto 49/51 mg BID Hydralazine 50 mg q8h Imdur 60 mg daily Spironolactone 12.5 mg daily  Prior to admission HF Medications: None - no meds within the last 6 months  Pertinent Lab Values: . Serum creatinine 0.98, BUN 10, Potassium 3.7, Sodium 137, BNP 963.1  Vital Signs: . Weight: 237 lbs (admission weight: 246 lbs) . Blood pressure: 140-200/70-90s  . Heart rate: 80-90s   Medication Assistance / Insurance Benefits Check: Does the patient have prescription insurance?  Yes Type of insurance plan: Medicaid  Outpatient Pharmacy:  Prior to admission outpatient pharmacy: [facility] Is the patient willing to use [facility] pharmacy at discharge? Yes Is the patient willing to transition their outpatient pharmacy to utilize a [facility] outpatient pharmacy?   Pending    Assessment: 1. Acute systolic CHF (EF 94-49%), likely due to uncontrolled HTN. Patient deciding on if she would like to pursue cardiac cath for ischemic evaluation. NYHA class II symptoms. - Continue carvedilol 6.25 mg BID - Continue Entresto 49/51 mg BID - Continue hydralazine 50 mg q8h and Imdur 60 mg daily. Consider increasing hydralazine for further BP reduction. - Continue spironolactone 12.5 mg daily. Consider increasing to 25 mg daily - Would like to titrate up on other HF meds in attempts to remove non-HF anti-HTN medications (amlodipine, hydrochlorothiazide) - Consider adding SGLT2i prior to discharge  Plan: 1) Medication changes recommended at this time: - Increase hydralazine to 75 mg TID - Increase spironolactone to  25 mg daily  2)  Education  - To be completed prior to discharge  Sharen Hones, PharmD, BCPS Heart Failure Stewardship Pharmacist Phone [phone]

## 2020-03-03 NOTE — Consult Note (Signed)
Ref: Patient, No Pcp Per   Subjective:  Feeling better. Improved BP.  Now on Entresto, Carvedilol, Amlodipine, hydralazine, isosorbide and spironolactone. Good diuresis yesterday.  Objective:  Vital Signs in the last 24 hours: Temp:  [97.8 F (36.6 C)-98.5 F (36.9 C)] 97.8 F (36.6 C) (09/23 1216) Pulse Rate:  [84-97] 97 (09/23 1216) Cardiac Rhythm: Normal sinus rhythm (09/23 0700) Resp:  [18] 18 (09/23 1216) BP: (134-191)/(74-98) 138/74 (09/23 1216) SpO2:  [98 %-99 %] 98 % (09/23 1216) Weight:  [107.7 kg] 107.7 kg (09/23 0411)  Physical Exam: BP Readings from Last 1 Encounters:  03/03/20 138/74     Wt Readings from Last 1 Encounters:  03/03/20 107.7 kg    Weight change: -4.037 kg Body mass index is 39.51 kg/m. HEENT: [place]/AT, Eyes-Brown, Conjunctiva-Pink, Sclera-Non-icteric Neck: No JVD, No bruit, Trachea midline. Lungs:  Clear, Bilateral. Cardiac:  Regular rhythm, normal S1 and S2, no S3. III/VI systolic murmur. Abdomen:  Soft, non-tender. BS present. Extremities:  1 + edema present. No cyanosis. No clubbing. CNS: AxOx3, Cranial nerves grossly intact, moves all 4 extremities.  Skin: Warm and dry.   Intake/Output from previous day: 09/22 0701 - 09/23 0700 In: 600 [P.O.:600] Out: 1700 [Urine:1700]    Lab Results: BMET    Component Value Date/Time   NA 137 03/03/2020 1004   NA 136 03/02/2020 0541   NA 140 03/01/2020 0348   K 3.7 03/03/2020 1004   K 4.1 03/02/2020 0541   K 3.6 03/01/2020 0348   CL 102 03/03/2020 1004   CL 97 (L) 03/02/2020 0541   CL 103 03/01/2020 0348   CO2 28 03/03/2020 1004   CO2 27 03/02/2020 0541   CO2 27 03/01/2020 0348   GLUCOSE 265 (H) 03/03/2020 1004   GLUCOSE 127 (H) 03/02/2020 0541   GLUCOSE 140 (H) 03/01/2020 0348   BUN 10 03/03/2020 1004   BUN 14 03/02/2020 0541   BUN 12 03/01/2020 0348   CREATININE 0.98 03/03/2020 1004   CREATININE 0.99 03/02/2020 0541   CREATININE 1.08 (H) 03/01/2020 0348   CALCIUM 8.1 (L) 03/03/2020  1004   CALCIUM 8.4 (L) 03/02/2020 0541   CALCIUM 8.1 (L) 03/01/2020 0348   GFRNONAA >60 03/03/2020 1004   GFRNONAA >60 03/02/2020 0541   GFRNONAA 60 (L) 03/01/2020 0348   GFRAA >60 03/03/2020 1004   GFRAA >60 03/02/2020 0541   GFRAA >60 03/01/2020 0348   CBC    Component Value Date/Time   WBC 9.3 03/03/2020 1004   RBC 4.20 03/03/2020 1004   HGB 10.8 (L) 03/03/2020 1004   HCT 35.7 (L) 03/03/2020 1004   PLT 312 03/03/2020 1004   MCV 85.0 03/03/2020 1004   MCH 25.7 (L) 03/03/2020 1004   MCHC 30.3 03/03/2020 1004   RDW 14.7 03/03/2020 1004   LYMPHSABS 1.9 07/25/2007 1645   MONOABS 0.7 07/25/2007 1645   EOSABS 0.1 07/25/2007 1645   BASOSABS 0.0 07/25/2007 1645   HEPATIC Function Panel No results for input(s): PROT in the last 8760 hours.  Invalid input(s):  ALBUMIN,  AST,  ALT,  ALKPHOS,  BILIDIR,  IBILI HEMOGLOBIN A1C No components found for: HGA1C,  MPG CARDIAC ENZYMES No results found for: CKTOTAL, CKMB, CKMBINDEX, TROPONINI BNP No results for input(s): PROBNP in the last 8760 hours. TSH No results for input(s): TSH in the last 8760 hours. CHOLESTEROL No results for input(s): CHOL in the last 8760 hours.  Scheduled Meds: . amLODipine  5 mg Oral BID  . carvedilol  6.25 mg Oral  BID WC  . enoxaparin (LOVENOX) injection  40 mg Subcutaneous Q24H  . hydrALAZINE  50 mg Oral Q8H  . hydrochlorothiazide  12.5 mg Oral Daily  . isosorbide mononitrate  60 mg Oral Daily  . sacubitril-valsartan  1 tablet Oral BID  . sodium chloride flush  3 mL Intravenous Q12H  . sodium chloride flush  3 mL Intravenous Q12H  . [START ON 03/04/2020] spironolactone  25 mg Oral Daily   Continuous Infusions: . sodium chloride     PRN Meds:.sodium chloride, acetaminophen **OR** acetaminophen, hydrALAZINE, HYDROcodone-acetaminophen, ondansetron **OR** ondansetron (ZOFRAN) IV, polyethylene glycol, sodium chloride flush  Assessment/Plan: Hypertensive crisis, resolved Combined systolic and diastolic  left heart failure Moderate MR Severe TR Moderate to severe pulmonary hypertension Obesity Headache  Adjust hydralazine dose back to 50 mg. daily to decrease heart rate with possible increase in Coreg dose tomorrow. Continue 25 mg. Spironolactone daily Check renal function in AM. Increase activity for discharge home soon. F/U in 1 week in office.   LOS: 2 days   Time spent including chart review, lab review, examination, discussion with patient, nurse and pharmacy : 30 min   Orpah Cobb  MD  03/03/2020, 6:00 PM

## 2020-03-03 NOTE — Progress Notes (Signed)
PROGRESS NOTE  Susan Hays LGX:[id number] DOB: May 04, 1971 DOA: 02/29/2020 PCP: Patient, No Pcp Per   LOS: 2 days   Brief narrative: As per HPI,  Susan Hays is a 49 y.o. femalewith medical history significant forhypertension and obesity,  presented to the hospital secondary to headaches and hypertension found to have hypertensive crisis. She was then started on blood pressure medications and cardiology was consulted for further management.  Of note, patient did have a run out of her medications for 6 months and was in hypertensive crisis with heart failure.  2 D Echocardiogram showed LV ejection fraction of 25 to 30%.  Assessment/Plan:  Principal Problem:   Hypertensive crisis Active Problems:   Elevated troponin   Chronic combined systolic (congestive) and diastolic (congestive) heart failure (HCC)   Chronic right-sided CHF (congestive heart failure) (HCC)   Biatrial enlargement   Accelerated hypertension secondary to medication noncompliance.  Patient has been restarted on medications with improvement in blood pressure.  Initial blood pressure was systolic more than 200.  Currently on amlodipine, hydrochlorothiazide, hydralazine, Coreg, and Entresto.  Aim is to decrease amlodipine HCTZ and titrate up on hydralazine and Entresto.  Acute on likely chronic combined systolic and diastolic heart failure.  New diagnosis.  2D echocardiogram shows moderate mitral regurgitation severe TR and moderate to severe pulmonary hypertension with LV Ejection fraction of 25 to 30% with grade 2 diastolic dysfunction and global hypokinesis.  Likely secondary to accelerated hypertension.   on Coreg, isosorbide, hydralazine.  On amlodipine and hydrochlorothiazide- aim to titrate off when blood pressure is adequately controlled. Seen by cardiology and underwent stress test which showed no reversible ischemia or infarction.  Left ventricle ejection fraction at 44%.  Global hypomotility was noted.  Good  diuresis noted after addition of Entresto.  Still has peripheral edema.  Monitor creatinine levels.  Creatinine of 0.9 today.  Elevated troponins likely secondary to hypertensive crisis.  Cardiology on board.  Status post stress test.  Morbid obesity.  Would benefit significantly with weight loss.  DVT prophylaxis: enoxaparin (LOVENOX) injection 40 mg Start: 02/29/20 2145   Code Status: Full code  Family Communication: None  Status is: Inpatient  Remains inpatient appropriate because:Inpatient level of care appropriate due to severity of illness and Decompensated heart failure, accelerated hypertension   Dispo: The patient is from: Home              Anticipated d/c is to: Home              Anticipated d/c date is: 2 days, titrating medications for adequate blood pressure control and volume control.              Patient currently is not medically stable to d/c.  Consultants:  Cardiology  Procedures:  Stress test  Antibiotics:  . None  Anti-infectives (From admission, onward)   None     Subjective: Today, patient was seen and examined at bedside.  Appears to be tearful at times because of her illness.  Denies any shortness of breath, cough, fever.,  Chest pain or palpitation.  Objective: Vitals:   03/03/20 0739 03/03/20 1216  BP: (!) 151/77 138/74  Pulse: 87 97  Resp:  18  Temp:  97.8 F (36.6 C)  SpO2:  98%    Intake/Output Summary (Last 24 hours) at 03/03/2020 1250 Last data filed at 03/03/2020 0852 Gross per 24 hour  Intake 960 ml  Output 1700 ml  Net -740 ml   [employer]  03/01/20 1654 03/02/20 0512 03/03/20 0411  Weight: 111.7 kg 110.1 kg 107.7 kg   Body mass index is 39.51 kg/m.   Physical Exam:  GENERAL: Patient is alert awake and oriented. Not in obvious distress.  Morbidly obese HENT: No scleral pallor or icterus. Pupils equally reactive to light. Oral mucosa is moist NECK: is supple, no gross swelling noted. CHEST: Coarse breath sounds  noted bilateral. CVS: S1 and S2 heard, Regular rate and rhythm.  ABDOMEN: Soft, non-tender, bowel sounds are present. EXTREMITIES: Bilateral lower extremity edema noted. CNS: Cranial nerves are intact. No focal motor deficits. SKIN: warm and dry without rashes.  Data Review: I have personally reviewed the following laboratory data and studies,  CBC: Recent Labs  Lab 02/29/20 1214 03/01/20 0348 03/02/20 1446 03/03/20 1004  WBC 10.6* 8.3 10.3 9.3  HGB 11.1* 10.8* 11.2* 10.8*  HCT 37.8 37.1 37.1 35.7*  MCV 88.5 88.5 86.3 85.0  PLT 317 273 324 312   Basic Metabolic Panel: Recent Labs  Lab 02/29/20 1214 03/01/20 0348 03/02/20 0541 03/03/20 1004  NA 138 140 136 137  K 3.9 3.6 4.1 3.7  CL 101 103 97* 102  CO2 24 27 27 28   GLUCOSE 144* 140* 127* 265*  BUN 18 12 14 10   CREATININE 1.02* 1.08* 0.99 0.98  CALCIUM 8.1* 8.1* 8.4* 8.1*  MG  --   --   --  1.4*  PHOS  --   --   --  2.7   Liver Function Tests: No results for input(s): AST, ALT, ALKPHOS, BILITOT, PROT, ALBUMIN in the last 168 hours. No results for input(s): LIPASE, AMYLASE in the last 168 hours. No results for input(s): AMMONIA in the last 168 hours. Cardiac Enzymes: No results for input(s): CKTOTAL, CKMB, CKMBINDEX, TROPONINI in the last 168 hours. BNP (last 3 results) Recent Labs    03/01/20 0812  BNP 963.1*    ProBNP (last 3 results) No results for input(s): PROBNP in the last 8760 hours.  CBG: No results for input(s): GLUCAP in the last 168 hours. Recent Results (from the past 240 hour(s))  SARS Coronavirus 2 by RT PCR (hospital order, performed in [facility] hospital lab) Nasopharyngeal Nasopharyngeal Swab     Status: None   Collection Time: 02/29/20  9:45 PM   Specimen: Nasopharyngeal Swab  Result Value Ref Range Status   SARS Coronavirus 2 NEGATIVE NEGATIVE Final    Comment: (NOTE) SARS-CoV-2 target nucleic acids are NOT DETECTED.  The SARS-CoV-2 RNA is generally detectable in upper and  lower respiratory specimens during the acute phase of infection. The lowest concentration of SARS-CoV-2 viral copies this assay can detect is 250 copies / mL. A negative result does not preclude SARS-CoV-2 infection and should not be used as the sole basis for treatment or other patient management decisions.  A negative result may occur with improper specimen collection / handling, submission of specimen other than nasopharyngeal swab, presence of viral mutation(s) within the areas targeted by this assay, and inadequate number of viral copies (<250 copies / mL). A negative result must be combined with clinical observations, patient history, and epidemiological information.  Fact Sheet for Patients:   [facility]  Fact Sheet for Healthcare Providers: 03/02/20  This test is not yet approved or  cleared by the [employer] FDA and has been authorized for detection and/or diagnosis of SARS-CoV-2 by FDA under an Emergency Use Authorization (EUA).  This EUA will remain in effect (meaning this test can be used) for the  duration of the COVID-19 declaration under Section 564(b)(1) of the Act, 21 U.S.C. section 360bbb-3(b)(1), unless the authorization is terminated or revoked sooner.  Performed at [facility], [address], [place], [place] [address]      Studies: NM Myocar Multi W/Spect Izetta Dakin Motion / EF  Result Date: 03/02/2020 CLINICAL DATA:  Dilated cardiomyopathy. EXAM: MYOCARDIAL IMAGING WITH SPECT (REST AND PHARMACOLOGIC-STRESS) GATED LEFT VENTRICULAR WALL MOTION STUDY LEFT VENTRICULAR EJECTION FRACTION TECHNIQUE: Standard myocardial SPECT imaging was performed after resting intravenous injection of 10 mCi Tc-38m tetrofosmin. Subsequently, intravenous infusion of Lexiscan was performed under the supervision of the Cardiology staff. At peak effect of the drug, 30 mCi Tc-52m tetrofosmin was injected intravenously  and standard myocardial SPECT imaging was performed. Quantitative gated imaging was also performed to evaluate left ventricular wall motion, and estimate left ventricular ejection fraction. COMPARISON:  None. FINDINGS: Perfusion: No decreased activity in the left ventricle on stress imaging to suggest reversible ischemia or infarction. Wall Motion: Mild global hypomotility is noted. Left Ventricular Ejection Fraction: 44 % End diastolic volume 128 ml End systolic volume 72 ml IMPRESSION: 1. No reversible ischemia or infarction. 2. Mild global hypomotility is noted. 3. Left ventricular ejection fraction 44% 4. Non invasive risk stratification*: Intermediate *2012 Appropriate Use Criteria for Coronary Revascularization Focused Update: J Am Coll Cardiol. 2012;59(9):857-881. http://content.dementiazones.com.aspx?articleid=[email or web address] Electronically Signed   By: Lupita Raider M.D.   On: 03/02/2020 14:30     Joycelyn Das, MD  [facility] 03/03/2020

## 2020-03-04 LAB — CBC
HCT: 36.9 % (ref 36.0–46.0)
Hemoglobin: 11.2 g/dL — ABNORMAL LOW (ref 12.0–15.0)
MCH: 25.7 pg — ABNORMAL LOW (ref 26.0–34.0)
MCHC: 30.4 g/dL (ref 30.0–36.0)
MCV: 84.8 fL (ref 80.0–100.0)
Platelets: 307 10*3/uL (ref 150–400)
RBC: 4.35 MIL/uL (ref 3.87–5.11)
RDW: 14.8 % (ref 11.5–15.5)
WBC: 9.4 10*3/uL (ref 4.0–10.5)
nRBC: 0 % (ref 0.0–0.2)

## 2020-03-04 LAB — MAGNESIUM: Magnesium: 1.7 mg/dL (ref 1.7–2.4)

## 2020-03-04 LAB — BASIC METABOLIC PANEL
Anion gap: 11 (ref 5–15)
BUN: 10 mg/dL (ref 6–20)
CO2: 25 mmol/L (ref 22–32)
Calcium: 8 mg/dL — ABNORMAL LOW (ref 8.9–10.3)
Chloride: 99 mmol/L (ref 98–111)
Creatinine, Ser: 1.02 mg/dL — ABNORMAL HIGH (ref 0.44–1.00)
GFR calc Af Amer: >60 mL/min (ref >60)
GFR calc non Af Amer: >60 mL/min (ref >60)
Glucose, Bld: 297 mg/dL — ABNORMAL HIGH (ref 70–99)
Potassium: 3.9 mmol/L (ref 3.5–5.1)
Sodium: 135 mmol/L (ref 135–145)

## 2020-03-04 MED ORDER — SACUBITRIL-VALSARTAN 49-51 MG PO TABS: 1.0000 | ORAL_TABLET | Freq: Two times a day (BID) | ORAL | 0 refills | Status: DC

## 2020-03-04 MED ORDER — CARVEDILOL 12.5 MG PO TABS: 12.5000 mg | ORAL_TABLET | Freq: Two times a day (BID) | ORAL | Status: DC

## 2020-03-04 MED ORDER — AMLODIPINE BESYLATE 5 MG PO TABS
5.0000 mg | ORAL_TABLET | Freq: Every day | ORAL | 0 refills | Status: DC
Start: 2020-03-04 — End: 2020-03-30

## 2020-03-04 MED ORDER — CARVEDILOL 12.5 MG PO TABS
12.5000 mg | ORAL_TABLET | Freq: Two times a day (BID) | ORAL | 0 refills | Status: DC
Start: 2020-03-04 — End: 2020-03-30

## 2020-03-04 MED ORDER — ISOSORBIDE MONONITRATE ER 60 MG PO TB24
60.0000 mg | ORAL_TABLET | Freq: Every day | ORAL | 0 refills | Status: DC
Start: 2020-03-04 — End: 2020-03-30

## 2020-03-04 MED ORDER — HYDROCHLOROTHIAZIDE 12.5 MG PO TABS
12.5000 mg | ORAL_TABLET | Freq: Every day | ORAL | 0 refills | Status: DC
Start: 2020-03-04 — End: 2020-03-04

## 2020-03-04 MED ORDER — SPIRONOLACTONE 25 MG PO TABS
25.0000 mg | ORAL_TABLET | Freq: Every day | ORAL | 0 refills | Status: DC
Start: 2020-03-04 — End: 2020-03-30

## 2020-03-04 MED ORDER — MAGNESIUM OXIDE 400 (241.3 MG) MG PO TABS
400.0000 mg | ORAL_TABLET | Freq: Two times a day (BID) | ORAL | 0 refills | Status: DC
Start: 2020-03-04 — End: 2021-03-13

## 2020-03-04 MED ORDER — HYDRALAZINE HCL 50 MG PO TABS
50.0000 mg | ORAL_TABLET | Freq: Three times a day (TID) | ORAL | 0 refills | Status: DC
Start: 2020-03-04 — End: 2021-03-13

## 2020-03-04 MED FILL — ISOSORBIDE MN ER 60 MG TAB: 60 | 30 days supply | Qty: 30 | Fill #0

## 2020-03-04 MED FILL — hydrALAZINE HCL 50 MG TABS: 50 | 30 days supply | Qty: 90 | Fill #0

## 2020-03-04 MED FILL — AMLODIPINE BESYLATE 5 MG TA: 5 | 60 days supply | Qty: 60 | Fill #0

## 2020-03-04 MED FILL — ENTRESTO 49 MG-51 MG TABLET: 49-51 | 30 days supply | Qty: 60 | Fill #0

## 2020-03-04 MED FILL — SPIRONOLACTONE 25 MG TABLET: 25 | 30 days supply | Qty: 30 | Fill #0

## 2020-03-04 MED FILL — CARVEDILOL 12.5 MG TABLET: 12.5 | 30 days supply | Qty: 60 | Fill #0

## 2020-03-04 MED FILL — HYDROCHLOROTHIAZIDE 12.5 MG: 12.5 | 30 days supply | Qty: 30 | Fill #0

## 2020-03-04 MED FILL — MAGNESIUM OXIDE 400 MG TABS: 400 | 15 days supply | Qty: 30 | Fill #0

## 2020-03-04 NOTE — Consult Note (Signed)
   [facility] Inpatient Consult   03/04/2020  Emmani [facility] [birth year] [id number]  [place]:   Managed Medicaid:  Healthy [employer]   Referral received in progression meeting from MD and inpatient [facility] team regarding post hospital follow up needs with Managed Medicaid benefits in [facility]. MD states patient needs HF education, blood pressure cuff and monitoring, daily weights, assess for scale and new medication management.  Patient had no primary care provider and has an appointment set up with [facility] clinic for March 30, 2020 at 0930.   Review of patient's medical record reveals patient is new HF.   Primary Care Provider is to be set up with [facility] for post hospital follow up.  Plan:  Met with patient and explained [facility] follow up with her Managed Medicaid.  Patient verbally accepted information and needs for post hospital monitoring. She states she will get to appointment via cab usually, she welcomes follow up and believes she will not have trouble getting a scale or BP cuff.  Will have [facility] RN follow up.  For questions contact:   Natividad Brood, RN BSN [facility] Liaison  [phone] business mobile phone Toll free office [phone]  Fax number: [phone] [email or web address]

## 2020-03-04 NOTE — Plan of Care (Signed)
  Problem: Clinical Measurements: Goal: Will remain free from infection Outcome: Completed/Met   Problem: Nutrition: Goal: Adequate nutrition will be maintained Outcome: Completed/Met   Problem: Elimination: Goal: Will not experience complications related to urinary retention Outcome: Completed/Met   Problem: Pain Managment: Goal: General experience of comfort will improve Outcome: Completed/Met   Problem: Safety: Goal: Ability to remain free from injury will improve Outcome: Completed/Met   Problem: Skin Integrity: Goal: Risk for impaired skin integrity will decrease Outcome: Completed/Met

## 2020-03-04 NOTE — Progress Notes (Signed)
Patient taken out for home is discharged.

## 2020-03-04 NOTE — Progress Notes (Addendum)
Heart Failure Stewardship Pharmacist Progress Note   PCP: Patient, No Pcp Per PCP-Cardiologist: No primary care provider on file.    HPI:  49 yo F with PMH of HTN. She presented to the ED on 02/29/20 with recurrent headaches and hypertensive urgency. An ECHO was done on 03/01/20 and found to have LVEF of 25-30% and moderate RV dysfunction.  Current HF Medications: Carvedilol 12.5 mg BID Entresto 49/51 mg BID Hydralazine 50 mg q8h Imdur 60 mg daily Spironolactone 25 mg daily  Prior to admission HF Medications: None - no meds within the last 6 months  Pertinent Lab Values: . BMET pending; BNP 963.1  Vital Signs: . Weight: 228 lbs (admission weight: 246 lbs) . Blood pressure: 140/70s  . Heart rate: 90s   Medication Assistance / Insurance Benefits Check: Does the patient have prescription insurance?  Yes Type of insurance plan: Medicaid  Outpatient Pharmacy:  Prior to admission outpatient pharmacy: [facility] Is the patient willing to use [facility] pharmacy at discharge? Yes Is the patient willing to transition their outpatient pharmacy to utilize a [facility] outpatient pharmacy?   Pending    Assessment: 1. Acute systolic CHF (EF 53-64%), likely due to uncontrolled HTN. No reversible ischemia on nuclear stress test. NYHA class II symptoms. - Continue carvedilol 12.5 mg BID - Continue Entresto 49/51 mg BID - Continue hydralazine 50 mg q8h and Imdur 60 mg daily - Continue spironolactone 25 mg daily - Consider stopping HCTZ - Would like to titrate up on other HF meds in attempts to remove non-HF anti-HTN medications (amlodipine, hydrochlorothiazide) - Consider adding SGLT2i prior to discharge  Plan: 1) Medication changes recommended at this time: - Stop HCTZ  2)  Education  - To be completed prior to discharge  Sharen Hones, PharmD, BCPS Heart Failure Stewardship Pharmacist Phone [phone]

## 2020-03-04 NOTE — Consult Note (Addendum)
Ref: Patient, No Pcp Per   Subjective:  Awake. No chest pain. Ambulated some yesterday. Aware of heart healthy diet and activity.  Mild tacycardia otherwise VS stable.  Objective:  Vital Signs in the last 24 hours: Temp:  [97.8 F (36.6 C)-98.5 F (36.9 C)] 98.5 F (36.9 C) (09/24 0523) Pulse Rate:  [92-101] 101 (09/24 0523) Cardiac Rhythm: Normal sinus rhythm (09/24 0734) Resp:  [18] 18 (09/24 0523) BP: (138-143)/(71-84) 143/84 (09/24 0523) SpO2:  [98 %] 98 % (09/24 0523) Weight:  [103.5 kg] 103.5 kg (09/24 0523)  Physical Exam: BP Readings from Last 1 Encounters:  03/04/20 (!) 143/84     Wt Readings from Last 1 Encounters:  03/04/20 103.5 kg    Weight change: -4.173 kg Body mass index is 37.97 kg/m. HEENT: [place]/AT, Eyes-Brown, Conjunctiva-Pink, Sclera-Non-icteric Neck: No JVD, No bruit, Trachea midline. Lungs:  Clear, Bilateral. Cardiac:  Regular rhythm, normal S1 and S2, no S3. II/VI systolic murmur. Abdomen:  Soft, non-tender. BS present. Extremities:  1 + edema present. No cyanosis. No clubbing. CNS: AxOx3, Cranial nerves grossly intact, moves all 4 extremities.  Skin: Warm and dry.   Intake/Output from previous day: 09/23 0701 - 09/24 0700 In: 820 [P.O.:820] Out: 3600 [Urine:3600]    Lab Results: BMET    Component Value Date/Time   NA 137 03/03/2020 1004   NA 136 03/02/2020 0541   NA 140 03/01/2020 0348   K 3.7 03/03/2020 1004   K 4.1 03/02/2020 0541   K 3.6 03/01/2020 0348   CL 102 03/03/2020 1004   CL 97 (L) 03/02/2020 0541   CL 103 03/01/2020 0348   CO2 28 03/03/2020 1004   CO2 27 03/02/2020 0541   CO2 27 03/01/2020 0348   GLUCOSE 265 (H) 03/03/2020 1004   GLUCOSE 127 (H) 03/02/2020 0541   GLUCOSE 140 (H) 03/01/2020 0348   BUN 10 03/03/2020 1004   BUN 14 03/02/2020 0541   BUN 12 03/01/2020 0348   CREATININE 0.98 03/03/2020 1004   CREATININE 0.99 03/02/2020 0541   CREATININE 1.08 (H) 03/01/2020 0348   CALCIUM 8.1 (L) 03/03/2020 1004    CALCIUM 8.4 (L) 03/02/2020 0541   CALCIUM 8.1 (L) 03/01/2020 0348   GFRNONAA >60 03/03/2020 1004   GFRNONAA >60 03/02/2020 0541   GFRNONAA 60 (L) 03/01/2020 0348   GFRAA >60 03/03/2020 1004   GFRAA >60 03/02/2020 0541   GFRAA >60 03/01/2020 0348   CBC    Component Value Date/Time   WBC 9.3 03/03/2020 1004   RBC 4.20 03/03/2020 1004   HGB 10.8 (L) 03/03/2020 1004   HCT 35.7 (L) 03/03/2020 1004   PLT 312 03/03/2020 1004   MCV 85.0 03/03/2020 1004   MCH 25.7 (L) 03/03/2020 1004   MCHC 30.3 03/03/2020 1004   RDW 14.7 03/03/2020 1004   LYMPHSABS 1.9 07/25/2007 1645   MONOABS 0.7 07/25/2007 1645   EOSABS 0.1 07/25/2007 1645   BASOSABS 0.0 07/25/2007 1645   HEPATIC Function Panel No results for input(s): PROT in the last 8760 hours.  Invalid input(s):  ALBUMIN,  AST,  ALT,  ALKPHOS,  BILIDIR,  IBILI HEMOGLOBIN A1C No components found for: HGA1C,  MPG CARDIAC ENZYMES No results found for: CKTOTAL, CKMB, CKMBINDEX, TROPONINI BNP No results for input(s): PROBNP in the last 8760 hours. TSH No results for input(s): TSH in the last 8760 hours. CHOLESTEROL No results for input(s): CHOL in the last 8760 hours.  Scheduled Meds: . amLODipine  5 mg Oral BID  . carvedilol  12.5 mg Oral BID WC  . enoxaparin (LOVENOX) injection  40 mg Subcutaneous Q24H  . hydrALAZINE  50 mg Oral Q8H  . hydrochlorothiazide  12.5 mg Oral Daily  . isosorbide mononitrate  60 mg Oral Daily  . magnesium oxide  400 mg Oral BID  . sacubitril-valsartan  1 tablet Oral BID  . sodium chloride flush  3 mL Intravenous Q12H  . sodium chloride flush  3 mL Intravenous Q12H  . spironolactone  25 mg Oral Daily   Continuous Infusions: . sodium chloride     PRN Meds:.sodium chloride, acetaminophen **OR** acetaminophen, HYDROcodone-acetaminophen, ondansetron **OR** ondansetron (ZOFRAN) IV, polyethylene glycol, sodium chloride flush  Assessment/Plan: Hypertensive crisis resolved, Combined systolic and diastolic  heart failure resolved Moderate MR Severe TR Moderate to severe pulmonary systolic hypertension Obesity Headache  Increase Coreg dose. May discharge. Instructions for CHF care disscused.   LOS: 3 days   Time spent including chart review, lab review, examination, discussion with patient : 30  min   Orpah Cobb  MD  03/04/2020, 8:36 AM

## 2020-03-04 NOTE — Discharge Summary (Signed)
Physician Discharge Summary  [name]:[id number] DOB: 02-Feb-1971 DOA: 02/29/2020  PCP: Patient, No Pcp Per  Admit date: 02/29/2020 Discharge date: 03/04/2020  Admitted From: Home  Discharge disposition: Home   Recommendations for Outpatient Follow-Up:   . Follow up with your primary care provider clinic as has been scheduled. . Check CBC, BMP, magnesium in the next visit . Please follow-up with cardiology within 1 week. . In the hospital hyperglycemia was noted.  This will need outpatient work-up.  She might benefit from SGLT 2 inhibitors.   Discharge Diagnosis:   Principal Problem:   Hypertensive crisis Active Problems:   Elevated troponin   Chronic combined systolic (congestive) and diastolic (congestive) heart failure (HCC)   Chronic right-sided CHF (congestive heart failure) (HCC)   Biatrial enlargement  Discharge Condition: Improved.  Diet recommendation: Low sodium, heart healthy.    Wound care: None.  Code status: Full.   History of Present Illness:   Susan Hays a 49 y.o.femalewith medical history significant forhypertension and obesity,  presented to the hospital secondary to headaches and hypertension found to have hypertensive crisis. She was then started on blood pressure medications and cardiology was consulted for further management.  Of note, patient did have a run out of her medications for 6 months and was in hypertensive crisis with heart failure.  2 D Echocardiogram showed LV ejection fraction of 25 to 30%.  Patient was then admitted to the hospital for further evaluation and treatment.  Hospital Course:   Following conditions were addressed during hospitalization as listed below,  Accelerated hypertension secondary to medication noncompliance.  Patient has been restarted on medications with improvement in blood pressure.  Initial blood pressure was systolic more than 200.  Currently on amlodipine, hydrochlorothiazide,  hydralazine, Coreg, and Entresto.  Aim is to decrease amlodipine and HCTZ as outpatient.  Spoke with cardiology at bedside.  Acute on likely chronic combined systolic and diastolic heart failure.  New diagnosis.  2D echocardiogram showed moderate mitral regurgitation, severe TR and moderate to severe pulmonary hypertension with LV Ejection fraction of 25 to 30% with grade 2 diastolic dysfunction and global hypokinesis.  On Coreg, isosorbide, hydralazine,amlodipine and hydrochlorothiazide- aim to titrate off amlodipine and hydrochlorothiazide when blood pressure is adequately controlled. Seen by cardiology and underwent stress test which showed no reversible ischemia or infarction.  Left ventricle ejection fraction at 44%.  Global hypomotility was noted.  Good diuresis was obtained after addition of Entresto.  Still has peripheral edema.  Creatinine level of 0.9 and has remained stable. Patient will need outpatient titration of cardiac medications.  Elevated troponins likely secondary to hypertensive crisis.   Status post stress test.  Morbid obesity.  Would benefit significantly with weight loss.  Hyperglycemia.  Noted on routine blood work.  Would benefit from further work-up as outpatient.  Lifestyle modification.  This could be a component of metabolic syndrome.  Recommend outpatient work-up and treatment with SGLT 2 inhibitors.  Hypomagnesemia.  Was replenished.  Will give p.o. magnesium oxide on discharge.  Disposition.  At this time, patient is stable for disposition.  Spoke with cardiology at bedside.  Medical Consultants:    Cardiology  Procedures:    2D echo Subjective:   Today, patient was seen and examined at bedside.  Feels better with breathing.  Has ambulated some.  Denies any chest pain, headache, dizziness.  Discharge Exam:   Vitals:   03/03/20 2003 03/04/20 0523  BP: (!) 142/71 (!) 143/84  Pulse: 92 (!)  101  Resp: 18 18  Temp: 98.5 F (36.9 C) 98.5 F (36.9 C)    SpO2: 98% 98%   Vitals:   03/03/20 0739 03/03/20 1216 03/03/20 2003 03/04/20 0523  BP: (!) 151/77 138/74 (!) 142/71 (!) 143/84  Pulse: 87 97 92 (!) 101  Resp:  18 18 18   Temp:  97.8 F (36.6 C) 98.5 F (36.9 C) 98.5 F (36.9 C)  TempSrc:  Oral Oral Oral  SpO2:  98% 98% 98%  Weight:    103.5 kg  Height:       General: Alert awake, not in obvious distress, morbidly obese HENT: pupils equally reacting to light,  No scleral pallor or icterus noted. Oral mucosa is moist.  Chest: Coarse breath sounds noted bilaterally.   CVS: S1 &S2 heard. No murmur.  Regular rate and rhythm. Abdomen: Soft, nontender, nondistended.  Bowel sounds are heard.   Extremities: No cyanosis, clubbing but improved bilateral lower extremity edema.  Peripheral pulses are palpable. Psych: Alert, awake and oriented, normal mood CNS:  No cranial nerve deficits.  Power equal in all extremities.   Skin: Warm and dry.  No rashes noted.  The results of significant diagnostics from this hospitalization (including imaging, microbiology, ancillary and laboratory) are listed below for reference.     Diagnostic Studies:   DG Chest 2 View  Result Date: 02/29/2020 CLINICAL DATA:  Malignant hypertension EXAM: CHEST - 2 VIEW COMPARISON:  07/25/2007 FINDINGS: Lung volumes are small, but are symmetric. Minimal bibasilar atelectasis. No pneumothorax or pleural effusion. Mild cardiomegaly has developed, new since prior examination. The pulmonary vascularity is normal. No acute bone abnormality. IMPRESSION: Interval development of mild cardiomegaly. Electronically Signed   By: 07/27/2007 MD   On: 02/29/2020 21:56   ECHOCARDIOGRAM COMPLETE  Result Date: 03/01/2020    ECHOCARDIOGRAM REPORT   Patient Name:   Susan Hays Date of Exam: 03/01/2020 Medical Rec #:  03/03/2020        Height:       65.0 in Accession #:    [id number]       Weight:       230.0 lb Date of Birth:  22-Aug-1970         BSA:          2.099 m Patient Age:    49  years         BP:           126/83 mmHg Patient Gender: F                HR:           99 bpm. Exam Location:  Inpatient Procedure: 2D Echo Indications:    elevated troponin  History:        Patient has no prior history of Echocardiogram examinations.                 Risk Factors:Hypertension.  Sonographer:    10-11-1984 Referring Phys: Delcie Roch TIMOTHY S OPYD IMPRESSIONS  1. Left ventricular ejection fraction, by estimation, is 25 to 30%. The left ventricle has severely decreased function. The left ventricle demonstrates global hypokinesis. Left ventricular diastolic parameters are consistent with Grade II diastolic dysfunction (pseudonormalization). Elevated left atrial pressure. There is the interventricular septum is flattened in diastole ('D' shaped left ventricle), consistent with right ventricular volume overload.  2. Right ventricular systolic function is moderately reduced. The right ventricular size is mildly enlarged. There is severely elevated pulmonary artery systolic pressure.  3. Left atrial size was moderately dilated.  4. Right atrial size was moderately dilated.  5. Marked variation in flow velocities across the cardiac valves, apparently with a respiratory pattern, raises concern for increased ventricular interdependence (consider constrictive physiology).  6. The mitral valve is normal in structure. Mild to moderate mitral valve regurgitation. No evidence of mitral stenosis.  7. Tricuspid valve regurgitation is severe.  8. The aortic valve is normal in structure. Aortic valve regurgitation is not visualized. No aortic stenosis is present.  9. The inferior vena cava is dilated in size with <50% respiratory variability, suggesting right atrial pressure of 15 mmHg. FINDINGS  Left Ventricle: Left ventricular ejection fraction, by estimation, is 25 to 30%. The left ventricle has severely decreased function. The left ventricle demonstrates global hypokinesis. The left ventricular internal cavity  size was normal in size. There is no left ventricular hypertrophy. The interventricular septum is flattened in diastole ('D' shaped left ventricle), consistent with right ventricular volume overload. Left ventricular diastolic parameters are consistent with Grade II diastolic dysfunction (pseudonormalization). Elevated left atrial pressure. Right Ventricle: The right ventricular size is mildly enlarged. No increase in right ventricular wall thickness. Right ventricular systolic function is moderately reduced. There is severely elevated pulmonary artery systolic pressure. The tricuspid regurgitant velocity is 3.46 m/s, and with an assumed right atrial pressure of 15 mmHg, the estimated right ventricular systolic pressure is 62.9 mmHg. Left Atrium: Left atrial size was moderately dilated. Right Atrium: Right atrial size was moderately dilated. Pericardium: Marked variation in flow velocities across the cardiac valves, apparently with a respiratory pattern, raises concern for increased ventricular interdependence (consider constrictive physiology). There is no evidence of pericardial effusion. Mitral Valve: The mitral valve is normal in structure. Mild to moderate mitral valve regurgitation, with centrally-directed jet. No evidence of mitral valve stenosis. Tricuspid Valve: The tricuspid valve is grossly normal. Tricuspid valve regurgitation is severe. No evidence of tricuspid stenosis. Aortic Valve: The aortic valve is normal in structure. Aortic valve regurgitation is not visualized. No aortic stenosis is present. Pulmonic Valve: The pulmonic valve was normal in structure. Pulmonic valve regurgitation is not visualized. No evidence of pulmonic stenosis. Aorta: The aortic root is normal in size and structure. Venous: The inferior vena cava is dilated in size with less than 50% respiratory variability, suggesting right atrial pressure of 15 mmHg. IAS/Shunts: No atrial level shunt detected by color flow Doppler.  LEFT  VENTRICLE PLAX 2D LVIDd:         4.50 cm LVIDs:         3.80 cm LV PW:         1.10 cm LV IVS:        1.10 cm LVOT diam:     1.80 cm LV SV:         41 LV SV Index:   20 LVOT Area:     2.54 cm  RIGHT VENTRICLE            IVC RV S prime:     8.49 cm/s  IVC diam: 2.10 cm TAPSE (M-mode): 1.5 cm LEFT ATRIUM             Index       RIGHT ATRIUM           Index LA Vol (A2C):   79.9 ml 38.06 ml/m RA Area:     22.80 cm LA Vol (A4C):   71.7 ml 34.16 ml/m RA Volume:   72.40 ml  34.49 ml/m LA  Biplane Vol: 78.0 ml 37.16 ml/m  AORTIC VALVE LVOT Vmax:   107.00 cm/s LVOT Vmean:  69.600 cm/s LVOT VTI:    0.161 m  AORTA Ao Root diam: 2.90 cm Ao Asc diam:  3.10 cm MITRAL VALVE               TRICUSPID VALVE MV Area (PHT): 5.02 cm    TR Peak grad:   47.9 mmHg MV Decel Time: 151 msec    TR Vmax:        346.00 cm/s MV E velocity: 91.90 cm/s MV A velocity: 77.10 cm/s  SHUNTS MV E/A ratio:  1.19        Systemic VTI:  0.16 m                            Systemic Diam: 1.80 cm Mihai Croitoru MD Electronically signed by Thurmon FairMihai Croitoru MD Signature Date/Time: 03/01/2020/1:52:01 PM    Final      Labs:   Basic Metabolic Panel: Recent Labs  Lab 02/29/20 1214 02/29/20 1214 03/01/20 0348 03/01/20 0348 03/02/20 0541 03/03/20 1004  NA 138  --  140  --  136 137  K 3.9   < > 3.6   < > 4.1 3.7  CL 101  --  103  --  97* 102  CO2 24  --  27  --  27 28  GLUCOSE 144*  --  140*  --  127* 265*  BUN 18  --  12  --  14 10  CREATININE 1.02*  --  1.08*  --  0.99 0.98  CALCIUM 8.1*  --  8.1*  --  8.4* 8.1*  MG  --   --   --   --   --  1.4*  PHOS  --   --   --   --   --  2.7   < > = values in this interval not displayed.   GFR Estimated Creatinine Clearance: 82 mL/min (by C-G formula based on SCr of 0.98 mg/dL). Liver Function Tests: No results for input(s): AST, ALT, ALKPHOS, BILITOT, PROT, ALBUMIN in the last 168 hours. No results for input(s): LIPASE, AMYLASE in the last 168 hours. No results for input(s): AMMONIA in the last  168 hours. Coagulation profile No results for input(s): INR, PROTIME in the last 168 hours.  CBC: Recent Labs  Lab 02/29/20 1214 03/01/20 0348 03/02/20 1446 03/03/20 1004  WBC 10.6* 8.3 10.3 9.3  HGB 11.1* 10.8* 11.2* 10.8*  HCT 37.8 37.1 37.1 35.7*  MCV 88.5 88.5 86.3 85.0  PLT 317 273 324 312   Cardiac Enzymes: No results for input(s): CKTOTAL, CKMB, CKMBINDEX, TROPONINI in the last 168 hours. BNP: Invalid input(s): POCBNP CBG: No results for input(s): GLUCAP in the last 168 hours. D-Dimer No results for input(s): DDIMER in the last 72 hours. Hgb A1c No results for input(s): HGBA1C in the last 72 hours. Lipid Profile No results for input(s): CHOL, HDL, LDLCALC, TRIG, CHOLHDL, LDLDIRECT in the last 72 hours. Thyroid function studies No results for input(s): TSH, T4TOTAL, T3FREE, THYROIDAB in the last 72 hours.  Invalid input(s): FREET3 Anemia work up No results for input(s): VITAMINB12, FOLATE, FERRITIN, TIBC, IRON, RETICCTPCT in the last 72 hours. Microbiology Recent Results (from the past 240 hour(s))  SARS Coronavirus 2 by RT PCR (hospital order, [facility]) Nasopharyngeal Nasopharyngeal Swab     Status: None   Collection  Time: 02/29/20  9:45 PM   Specimen: Nasopharyngeal Swab  Result Value Ref Range Status   SARS Coronavirus 2 NEGATIVE NEGATIVE Final    Comment: (NOTE) SARS-CoV-2 target nucleic acids are NOT DETECTED.  The SARS-CoV-2 RNA is generally detectable in upper and lower respiratory specimens during the acute phase of infection. The lowest concentration of SARS-CoV-2 viral copies this assay can detect is 250 copies / mL. A negative result does not preclude SARS-CoV-2 infection and should not be used as the sole basis for treatment or other patient management decisions.  A negative result may occur with improper specimen collection / handling, submission of specimen other than nasopharyngeal swab, presence of viral  mutation(s) within the areas targeted by this assay, and inadequate number of viral copies (<250 copies / mL). A negative result must be combined with clinical observations, patient history, and epidemiological information.  Fact Sheet for Patients:   BoilerBrush.com.cy  Fact Sheet for Healthcare Providers: https://pope.com/  This test is not yet approved or  cleared by the [place] FDA and has been authorized for detection and/or diagnosis of SARS-CoV-2 by FDA under an Emergency Use Authorization (EUA).  This EUA will remain in effect (meaning this test can be used) for the duration of the COVID-19 declaration under Section 564(b)(1) of the Act, 21 U.S.C. section 360bbb-3(b)(1), unless the authorization is terminated or revoked sooner.  Performed at [facility], [address] [place], [place] [address]      Discharge Instructions:   Discharge Instructions    (HEART FAILURE PATIENTS) Call MD:  Anytime you have any of the following symptoms: 1) 3 pound weight gain in 24 hours or 5 pounds in 1 week 2) shortness of breath, with or without a dry hacking cough 3) swelling in the hands, feet or stomach 4) if you have to sleep on extra pillows at night in order to breathe.   Complete by: As directed    Avoid straining   Complete by: As directed    Diet - low sodium heart healthy   Complete by: As directed    Discharge instructions   Complete by: As directed    Follow-up with your primary care physician in 1 week. Check blood work at that time. Follow-up with cardiology as scheduled by the clinic. Please take all medications as prescribed. Fluid restriction 1500 mL/day. Low-salt diet. Please weigh yourself every day early in the morning and make a log of your weight. Check your blood pressure every day in the morning and evening and record it. Please present your weight and blood pressure readings to your primary care in the  next visit.   Heart Failure patients record your daily weight using the same scale at the same time of day   Complete by: As directed    Increase activity slowly   Complete by: As directed    Provide educational material   Complete by: As directed    Heart failure and hypertension   STOP any activity that causes chest pain, shortness of breath, dizziness, sweating, or exessive weakness   Complete by: As directed      Allergies as of 03/04/2020   No Known Allergies     Medication List    STOP taking these medications   chlorthalidone 100 MG tablet Commonly known as: HYGROTEN   cloNIDine 0.1 MG tablet Commonly known as: CATAPRES   hydrochlorothiazide 12.5 MG tablet Commonly known as: HYDRODIURIL   ibuprofen 200 MG tablet Commonly known as: ADVIL  TAKE these medications   acetaminophen 500 MG tablet Commonly known as: TYLENOL Take 1,000 mg by mouth every 6 (six) hours as needed for moderate pain.   amLODipine 5 MG tablet Commonly known as: NORVASC Take 1 tablet (5 mg total) by mouth daily.   carvedilol 12.5 MG tablet Commonly known as: COREG Take 1 tablet (12.5 mg total) by mouth 2 (two) times daily with a meal.   Excedrin Extra Strength 250-250-65 MG tablet Generic drug: aspirin-acetaminophen-caffeine Take 1-2 tablets by mouth every 6 (six) hours as needed for headache or migraine.   hydrALAZINE 50 MG tablet Commonly known as: APRESOLINE Take 1 tablet (50 mg total) by mouth 3 (three) times daily.   isosorbide mononitrate 60 MG 24 hr tablet Commonly known as: IMDUR Take 1 tablet (60 mg total) by mouth at bedtime.   magnesium oxide 400 (241.3 Mg) MG tablet Commonly known as: MAG-OX Take 1 tablet (400 mg total) by mouth 2 (two) times daily.   sacubitril-valsartan 49-51 MG Commonly known as: ENTRESTO Take 1 tablet by mouth 2 (two) times daily.   spironolactone 25 MG tablet Commonly known as: ALDACTONE Take 1 tablet (25 mg total) by mouth at bedtime.           Follow-up Information    [facility] On 03/30/2020.   Why: @ 9:30am Contact information: [address] [place] [address] [phone]       Orpah Cobb, MD. Schedule an appointment as soon as possible for a visit in 1 week(s).   Specialty: Cardiology Contact information: [address] [place] [employer] [place] [address] [phone]                Time coordinating discharge: 39 minutes  Signed:  Talan Gildner  [facility] 03/04/2020, 11:31 AM

## 2020-03-04 NOTE — Progress Notes (Signed)
D/C instructions and CHF booklet given and reviewed. Questions answered and encouraged to call with further concerns. Tele and IV removed, tolerated fair.

## 2020-03-04 NOTE — Plan of Care (Signed)
  Problem: Education: Goal: Knowledge of General Education information will improve Description: Including pain rating scale, medication(s)/side effects and non-pharmacologic comfort measures Outcome: Progressing   Problem: Health Behavior/Discharge Planning: Goal: Ability to manage health-related needs will improve Outcome: Progressing   Problem: Clinical Measurements: Goal: Ability to maintain clinical measurements within normal limits will improve Outcome: Progressing Goal: Diagnostic test results will improve Outcome: Progressing Goal: Respiratory complications will improve Outcome: Progressing Goal: Cardiovascular complication will be avoided Outcome: Progressing   Problem: Activity: Goal: Risk for activity intolerance will decrease Outcome: Progressing   Problem: Coping: Goal: Level of anxiety will decrease Outcome: Progressing   Problem: Elimination: Goal: Will not experience complications related to bowel motility Outcome: Progressing

## 2020-03-04 NOTE — Plan of Care (Signed)
  Problem: Education: Goal: Knowledge of General Education information will improve Description: Including pain rating scale, medication(s)/side effects and non-pharmacologic comfort measures Outcome: Adequate for Discharge   Problem: Health Behavior/Discharge Planning: Goal: Ability to manage health-related needs will improve Outcome: Adequate for Discharge   Problem: Clinical Measurements: Goal: Ability to maintain clinical measurements within normal limits will improve Outcome: Adequate for Discharge Goal: Diagnostic test results will improve Outcome: Adequate for Discharge Goal: Respiratory complications will improve Outcome: Adequate for Discharge Goal: Cardiovascular complication will be avoided Outcome: Adequate for Discharge   Problem: Activity: Goal: Risk for activity intolerance will decrease Outcome: Adequate for Discharge   Problem: Coping: Goal: Level of anxiety will decrease Outcome: Adequate for Discharge   Problem: Elimination: Goal: Will not experience complications related to bowel motility Outcome: Adequate for Discharge

## 2020-03-11 ENCOUNTER — Other Ambulatory Visit: Payer: Self-pay | Admitting: *Deleted

## 2020-03-11 NOTE — Patient Outreach (Signed)
Care Coordination  03/11/2020  Susan Hays [birth year] [id number]   An unsuccessful telephone outreach was attempted today. The patient was referred to the case management team for assistance with care management and care coordination.   Follow Up Plan: The Managed Medicaid care management team will reach out to the patient again over the next 7 days.   [name] RN, BSN [place]  [facility]

## 2020-03-11 NOTE — Patient Instructions (Signed)
Visit Information  Ms. Sharrie Rothman  - as a part of your Medicaid benefit, you are eligible for care management and care coordination services at no cost or copay. I was unable to reach you by phone today but would be happy to help you with your health related needs. Please feel free to call me @ [phone].   A member of the Managed Medicaid care management team will reach out to you again over the next 7 days.   [name] RN, BSN [place]  [facility]

## 2020-03-17 ENCOUNTER — Other Ambulatory Visit: Payer: Self-pay | Admitting: *Deleted

## 2020-03-17 NOTE — Patient Instructions (Signed)
Visit Information  Ms. Susan Hays  - as a part of your Medicaid benefit, you are eligible for care management and care coordination services at no cost or copay. I was unable to reach you by phone today but would be happy to help you with your health related needs. Please feel free to call me @ [phone].   A member of the Managed Medicaid care management team will reach out to you again over the next 7-14 days.   Shrihan Putt RN, BSN [place]  [facility] RN Care Coordinator   

## 2020-03-17 NOTE — Patient Outreach (Signed)
Care Coordination  03/17/2020  Lydiah Pong [facility] [birth year] [id number]   A second unsuccessful telephone outreach was attempted today. The patient was referred to the case management team for assistance with care management and care coordination.   Follow Up Plan: The Managed Medicaid care management team will reach out to the patient again over the next 7-14 days.  The patient has been provided with contact information for the Managed Medicaid care management team and has been advised to call with any health related questions or concerns.   [name] RN, BSN [place]  [facility]

## 2020-03-18 ENCOUNTER — Ambulatory Visit: Payer: Medicaid Other

## 2020-03-30 ENCOUNTER — Telehealth: Payer: Self-pay

## 2020-03-30 ENCOUNTER — Other Ambulatory Visit: Payer: Self-pay

## 2020-03-30 ENCOUNTER — Ambulatory Visit: Payer: Medicaid Other | Attending: Family Medicine | Admitting: Family Medicine

## 2020-03-30 ENCOUNTER — Encounter: Payer: Self-pay | Admitting: Family Medicine

## 2020-03-30 VITALS — BP 190/83 | HR 96 | Ht 65.0 in | Wt 220.0 lb

## 2020-03-30 DIAGNOSIS — E6609 Other obesity due to excess calories: Secondary | ICD-10-CM

## 2020-03-30 DIAGNOSIS — Z1159 Encounter for screening for other viral diseases: Secondary | ICD-10-CM

## 2020-03-30 DIAGNOSIS — Z6835 Body mass index (BMI) 35.0-35.9, adult: Secondary | ICD-10-CM

## 2020-03-30 DIAGNOSIS — I5042 Chronic combined systolic (congestive) and diastolic (congestive) heart failure: Secondary | ICD-10-CM

## 2020-03-30 DIAGNOSIS — R7303 Prediabetes: Secondary | ICD-10-CM

## 2020-03-30 DIAGNOSIS — I10 Essential (primary) hypertension: Secondary | ICD-10-CM

## 2020-03-30 LAB — POCT GLYCOSYLATED HEMOGLOBIN (HGB A1C): HbA1c, POC (controlled diabetic range): 6.2 % (ref 0.0–7.0)

## 2020-03-30 MED ORDER — ISOSORBIDE MONONITRATE ER 60 MG PO TB24
60.0000 mg | ORAL_TABLET | Freq: Every day | ORAL | 3 refills | Status: DC
Start: 2020-03-30 — End: 2020-07-20

## 2020-03-30 MED ORDER — DAPAGLIFLOZIN PROPANEDIOL 5 MG PO TABS: 5.0000 mg | ORAL_TABLET | Freq: Every day | ORAL | 3 refills | Status: DC

## 2020-03-30 MED ORDER — CARVEDILOL 12.5 MG PO TABS
12.5000 mg | ORAL_TABLET | Freq: Two times a day (BID) | ORAL | 3 refills | Status: DC
Start: 2020-03-30 — End: 2020-07-20

## 2020-03-30 MED ORDER — SPIRONOLACTONE 25 MG PO TABS
25.0000 mg | ORAL_TABLET | Freq: Every day | ORAL | 3 refills | Status: DC
Start: 2020-03-30 — End: 2020-07-20

## 2020-03-30 MED ORDER — SACUBITRIL-VALSARTAN 49-51 MG PO TABS
1.0000 | ORAL_TABLET | Freq: Two times a day (BID) | ORAL | 3 refills | Status: DC
Start: 2020-03-30 — End: 2020-12-21

## 2020-03-30 MED ORDER — AMLODIPINE BESYLATE 5 MG PO TABS
5.0000 mg | ORAL_TABLET | Freq: Every day | ORAL | 3 refills | Status: DC
Start: 2020-03-30 — End: 2020-11-16

## 2020-03-30 NOTE — Patient Instructions (Signed)

## 2020-03-30 NOTE — Progress Notes (Signed)
Name: Susan Hays   MRN: [id number]    DOB: [birth year]   Date:03/30/2020       Progress Note  Subjective  Chief Complaint  Chief Complaint  Patient presents with   Hospitalization Follow-up    HPI  Susan Hays is a 49 year old female being seen in the clinic for hospital follow-up. She has a past medical history of hypertension and obesity. She was admitted from 02/29/20 to 03/04/20 for hypertensive emergency with heart failure. Her blood pressure was greater than 200 systolic upon admission. Of note, she had run out of her hypertension medication for 6 months.  During her hospitalization she was placed on amlodipine, hydrochlorothiazide, hydralazine, coreg, Entresto, spironolactone.   Her echocardiogram revealed showed moderate mitral regurgitation, severe TR and moderate to severe pulmonary hypertension with LV Ejection fraction of 25 to 30% with grade 2 diastolic dysfunction and global hypokinesis. She was seen by cardiology and underwent a stress test which showed no reversible ischemia or infarction. Left ventricle ejection fraction at 44% and global hypomotility was noted.  Today, patient blood pressure elevated. Patient reports only taking one blood pressure medication, which was hydralazine. States when she takes all of her blood pressure medications together it gives her a headache. Reports low-salt diet and checking daily weight. Patient weight was 228 lbs during discharge and is down to 220 lbs today. Reports she does not have a blood pressure machine to check her blood pressure. She has not been seen by cardiology yet. Denies any blurred vision, chest pain, or headache today.  She denies influenza vaccine.  PHQ2/9: Depression screen PHQ 2/9 03/30/2020  Decreased Interest 0  Down, Depressed, Hopeless 0  PHQ - 2 Score 0  Altered sleeping 0  Tired, decreased energy 0  Change in appetite 0  Feeling bad or failure about yourself  0  Trouble concentrating 0  Moving slowly  or fidgety/restless 0  Suicidal thoughts 0  PHQ-9 Score 0   GAD 7 : Generalized Anxiety Score 03/30/2020  Nervous, Anxious, on Edge 0  Control/stop worrying 0  Worry too much - different things 0  Trouble relaxing 0  Restless 0  Easily annoyed or irritable 0  Afraid - awful might happen 0  Total GAD 7 Score 0      Patient Active Problem List   Diagnosis Date Noted   Chronic combined systolic (congestive) and diastolic (congestive) heart failure (HCC) 03/01/2020   Chronic right-sided CHF (congestive heart failure) (HCC) 03/01/2020   Biatrial enlargement 03/01/2020   Hypertensive crisis 02/29/2020   Elevated troponin 02/29/2020    Past Medical History:  Diagnosis Date   Hypertension     Past Surgical History:  Procedure Laterality Date   CESAREAN SECTION      Social History   Tobacco Use   Smoking status: Never Smoker   Smokeless tobacco: Never Used  Substance Use Topics   Alcohol use: No     Current Outpatient Medications:    acetaminophen (TYLENOL) 500 MG tablet, Take 1,000 mg by mouth every 6 (six) hours as needed for moderate pain., Disp: , Rfl:    amLODipine (NORVASC) 5 MG tablet, Take 1 tablet (5 mg total) by mouth daily., Disp: 60 tablet, Rfl: 0   aspirin-acetaminophen-caffeine (EXCEDRIN EXTRA STRENGTH) 250-250-65 MG tablet, Take 1-2 tablets by mouth every 6 (six) hours as needed for headache or migraine., Disp: , Rfl:    carvedilol (COREG) 12.5 MG tablet, Take 1 tablet (12.5 mg total) by mouth 2 (two) times daily  with a meal., Disp: 60 tablet, Rfl: 0   hydrALAZINE (APRESOLINE) 50 MG tablet, Take 1 tablet (50 mg total) by mouth 3 (three) times daily., Disp: 90 tablet, Rfl: 0   isosorbide mononitrate (IMDUR) 60 MG 24 hr tablet, Take 1 tablet (60 mg total) by mouth at bedtime., Disp: 30 tablet, Rfl: 0   magnesium oxide (MAG-OX) 400 (241.3 Mg) MG tablet, Take 1 tablet (400 mg total) by mouth 2 (two) times daily., Disp: 30 tablet, Rfl: 0    sacubitril-valsartan (ENTRESTO) 49-51 MG, Take 1 tablet by mouth 2 (two) times daily., Disp: 60 tablet, Rfl: 0   spironolactone (ALDACTONE) 25 MG tablet, Take 1 tablet (25 mg total) by mouth at bedtime., Disp: 30 tablet, Rfl: 0  No Known Allergies  Review of Systems  Constitutional: Negative for chills, fever, malaise/fatigue and weight loss.  Respiratory: Negative for cough, sputum production, shortness of breath and wheezing.   Cardiovascular: Negative for chest pain, palpitations and leg swelling.  Gastrointestinal: Negative for abdominal pain, heartburn, nausea and vomiting.  Musculoskeletal: Negative for joint pain.  Skin: Negative for rash.  Neurological: Negative for dizziness, tingling, focal weakness and headaches.  Psychiatric/Behavioral: Negative for depression. The patient is not nervous/anxious.     No other specific complaints in a complete review of systems (except as listed in HPI above).  Objective  Vitals:   03/30/20 0917  BP: (!) 190/83  Pulse: 96  SpO2: 100%  Weight: 220 lb (99.8 kg)  Height: 5\' 5"  (1.651 m)    Body mass index is 36.61 kg/m.  Nursing Note and Vital Signs reviewed.  Physical Exam Constitutional:      Appearance: Normal appearance. She is obese.  HENT:     Head: Normocephalic and atraumatic.  Neck:     Vascular: No JVD.  Cardiovascular:     Rate and Rhythm: Normal rate and regular rhythm.     Heart sounds: Murmur heard.      Comments: Systolic murmur Pulmonary:     Effort: Pulmonary effort is normal. No respiratory distress.     Breath sounds: Normal breath sounds. No wheezing or rhonchi.  Abdominal:     General: Bowel sounds are normal.     Palpations: Abdomen is soft. There is no mass.     Tenderness: There is no abdominal tenderness.  Musculoskeletal:     Right lower leg: Edema present.     Left lower leg: Edema present.     Comments: Non-pitting edema in bilateral lower extremities  Skin:    General: Skin is warm.   Neurological:     Mental Status: She is alert and oriented to person, place, and time.     Gait: Gait is intact.  Psychiatric:        Attention and Perception: Attention and perception normal.        Mood and Affect: Mood normal. Affect is tearful.        Speech: Speech normal.        Behavior: Behavior normal. Behavior is cooperative.      No results found for this or any previous visit (from the past 48 hour(s)).  Assessment & Plan  1. Chronic combined systolic (congestive) and diastolic (congestive) heart failure (HCC) LV Ejection fraction of 25 to 30% on echocardiogram (03/01/20). Counseled patient to check daily weights and to report a 3 pound weight gain in 24 hours or 5 pounds in 1 week. Recommend low-salt diet, weight loss, medication adherence, fluid restriction, and 150 minutes of  moderate exercise per week. Referral placed to cardiology. - Ambulatory referral to Cardiology - Brain natriuretic peptide  2. Prediabetes Blood glucose elevated during hospitalization. Hemoglobin A1c today is 6.2. Recommend low-carbohydrate diet, weight loss, and 150 minutes of daily exercise. Started patient on Farxiga, which is beneficial for patients with heart failure. - POCT glycosylated hemoglobin (Hb A1C) - dapagliflozin propanediol (FARXIGA) 5 MG TABS tablet; Take 1 tablet (5 mg total) by mouth daily before breakfast.  Dispense: 30 tablet; Refill: 3   3. Essential hypertension Uncontrolled Blood pressure elevated today. Repeat blood pressure still elevated at 188/82. Patient only took one blood pressure medication today. Patient states when she takes all of her medication at one time it gives her a headache. Advised patient to take all blood pressure medication, except hydralazine to see if this helps with her headache.  Recommend 150 minutes of exercise weekly, weight loss, low-salt diet, goal blood pressure <140/90, and medication adherence.   4. Hypomagnesemia Patient had low  magnesium throughout her hospitalization. Will check today and replace as necessary. - Magnesium  5. Need for hepatitis C screening test - HCV RNA quant rflx ultra or genotyp([facility])  6. Class 2 obesity due to excess calories without serious comorbidity with body mass index (BMI) of 35.0 to 35.9 in adult Counseled patient about weight loss and its health benefit. Recommend daily weights and to report a 3 pound weight gain in 24 hours or 5 pounds in 1 week. Patient has had a 8 lbs weight loss since hospitalization. Recommend daily exercise, low-carb, low-cholesterol, and low-salt diet.  Return in about 1 month (around 04/30/2020) for Hypertension.   Evaluation and management procedures were performed by me with NP Student in attendance, note written by NP student under my supervision and collaboration. I have reviewed the note and I agree with the management and plan.  In summary Ms. Yankey presents to establish care after recent hospitalization for hypertensive emergency with a new diagnosis of heart failure with reduced EF. Today her blood pressure remains elevated due to noncompliance with antihypertensives due to medication adverse effects - headache which is most likely as result of hydralazine. Advised to comply with all antihypertensives and hold off on hydralazine to reassess for improvement. Labs reveal a diagnosis of prediabetes. Commenced on SGLT2 which would be beneficial with regards to her CHF. Refered to cardiology for optimization of management.  Hoy Register, MD, FAAFP. [facility] [place], [place] [phone]   03/30/2020, 12:22 PM

## 2020-03-30 NOTE — Progress Notes (Signed)
States that her BP medications makes her head hurt.

## 2020-03-30 NOTE — Telephone Encounter (Signed)
PA approved for Farxiga through 03/30/2021

## 2020-03-31 ENCOUNTER — Ambulatory Visit: Payer: Self-pay

## 2020-04-01 ENCOUNTER — Telehealth: Payer: Self-pay

## 2020-04-01 ENCOUNTER — Other Ambulatory Visit: Payer: Self-pay | Admitting: Family Medicine

## 2020-04-01 LAB — BRAIN NATRIURETIC PEPTIDE: BNP: 249 pg/mL — ABNORMAL HIGH (ref 0.0–100.0)

## 2020-04-01 LAB — MAGNESIUM: Magnesium: 1.8 mg/dL (ref 1.6–2.3)

## 2020-04-01 LAB — HCV RNA QUANT RFLX ULTRA OR GENOTYP: HCV Quant Baseline: NOT DETECTED IU/mL

## 2020-04-01 MED ORDER — FUROSEMIDE 20 MG PO TABS: 20.0000 mg | ORAL_TABLET | Freq: Every day | ORAL | 3 refills | Status: DC

## 2020-04-01 NOTE — Telephone Encounter (Signed)
PA FOR ENTRESTO APPROVED THRU 04/01/21

## 2020-04-01 NOTE — Telephone Encounter (Signed)
-----   Message from Hoy Register, MD sent at 04/01/2020  9:31 AM EDT ----- Please inform her labs are stable except for elevated BNP which is a marker of fluid retention.  Given she also had leg swelling at her visit I have sent a prescription for furosemide to her pharmacy.

## 2020-04-01 NOTE — Telephone Encounter (Signed)
Patient name and DOB has been verified Patient was informed of lab results. Patient had no questions.  

## 2020-04-04 ENCOUNTER — Other Ambulatory Visit: Payer: Self-pay | Admitting: *Deleted

## 2020-04-04 ENCOUNTER — Other Ambulatory Visit: Payer: Self-pay

## 2020-04-04 NOTE — Patient Outreach (Signed)
Care Coordination - Case Manager  04/04/2020  Susan Hays [facility] 27-Feb-1971 [id number]  Subjective:  Susan Hays is an 49 y.o. year old female who is a primary patient of Patient, No Pcp Per.  Susan Hays was given information about Medicaid Managed Care team care coordination services today. Susan Hays agreed to services and verbal consent obtained  Review of patient status, laboratory and other test data was performed as part of evaluation for provision of services.  SDOH: SDOH Screenings   Alcohol Screen:   . Last Alcohol Screening Score (AUDIT): Not on file  Depression (PHQ2-9): Low Risk   . PHQ-2 Score: 0  Tobacco Use: Low Risk   . Smoking Tobacco Use: Never Smoker  . Smokeless Tobacco Use: Never Used  Transportation Needs: No Transportation Needs  . Lack of Transportation (Medical): No  . Lack of Transportation (Non-Medical): No   SDOH Interventions     Most Recent Value  SDOH Interventions  Transportation Interventions Other (Comment)  [Contact information for [employer] transportation given, pt was glad to have this information]      Objective:    No Known Allergies  Medications:    Medications Reviewed Today    Reviewed by Heidi Dach, RN (Registered Nurse) on 04/04/20 at 1310  Med List Status: <None>  Medication Order Taking? Sig Documenting Provider Last Dose Status Informant  acetaminophen (TYLENOL) 500 MG tablet [id number] Yes Take 1,000 mg by mouth every 6 (six) hours as needed for moderate pain. [provider] Taking Active Self  amLODipine (NORVASC) 5 MG tablet [id number] Yes Take 1 tablet (5 mg total) by mouth daily. Hoy Register, MD Taking Active   aspirin-acetaminophen-caffeine Genella Mech EXTRA STRENGTH) 2532384664 MG tablet [id number] Yes Take 1-2 tablets by mouth every 6 (six) hours as needed for headache or migraine. [provider] Taking Active   carvedilol (COREG) 12.5 MG tablet [id number] Yes Take 1 tablet  (12.5 mg total) by mouth 2 (two) times daily with a meal. Hoy Register, MD Taking Active   dapagliflozin propanediol (FARXIGA) 5 MG TABS tablet [id number] Yes Take 1 tablet (5 mg total) by mouth daily before breakfast. Hoy Register, MD Taking Active   furosemide (LASIX) 20 MG tablet [id number] Yes Take 1 tablet (20 mg total) by mouth daily. Hoy Register, MD Taking Active   hydrALAZINE (APRESOLINE) 50 MG tablet [id number] No Take 1 tablet (50 mg total) by mouth 3 (three) times daily.  Patient not taking: Reported on 04/04/2020   Joycelyn Das, MD Not Taking Active   isosorbide mononitrate (IMDUR) 60 MG 24 hr tablet [id number] Yes Take 1 tablet (60 mg total) by mouth at bedtime. Hoy Register, MD Taking Active   magnesium oxide (MAG-OX) 400 (241.3 Mg) MG tablet [id number] Yes Take 1 tablet (400 mg total) by mouth 2 (two) times daily. [name], [name], MD Taking Active   sacubitril-valsartan (ENTRESTO) 49-51 MG [id number] Yes Take 1 tablet by mouth 2 (two) times daily. Hoy Register, MD Taking Active   spironolactone (ALDACTONE) 25 MG tablet [id number] Yes Take 1 tablet (25 mg total) by mouth at bedtime. Hoy Register, MD Taking Active           Assessment:   Goals Addressed              This Visit's Progress   .  "I want to be healthy and improve my blood pressure" (pt-stated)        CARE PLAN ENTRY Medicaid Managed Care (see longtitudinal plan of care  for additional care plan information)  Objective:  . Last practice recorded BP readings:  BP Readings from Last 3 Encounters:  03/30/20 (!) 190/83  03/04/20 120/63  02/29/20 (!) 252/121   . Patient reports blood pressure taken at home today 140/85 and weight 218  Current Barriers:  Marland Kitchen Knowledge Deficits related to basic understanding of hypertension pathophysiology and self care management. Patient had recent admission for uncontrolled high blood pressure.  Case Manager Clinical Goal(s):  Marland Kitchen Over the next 45 days, patient  will verbalize understanding of plan for hypertension management . Over the next 45 days, patient will attend all scheduled medical appointments: PCP on 05/10/20 . Over the next 45 days, patient will demonstrate improved adherence to prescribed treatment plan for hypertension as evidenced by taking all medications as prescribed, monitoring and recording blood pressure as directed, adhering to low sodium/DASH diet, and monitoring daily weights  Interventions:  . Evaluation of current treatment plan related to hypertension self management and patient's adherence to plan as established by provider. . Reviewed medications with patient and discussed importance of compliance . Discussed plans with patient for ongoing care management follow up and provided patient with direct contact information for care management team . Advised patient, providing education and rationale, to monitor blood pressure daily and record, calling PCP for findings outside established parameters.  . Reviewed scheduled/upcoming provider appointments including: PCP on 05/10/20  Patient Self Care Activities:  . Self administers medications as prescribed . Attends all scheduled provider appointments . Calls provider office for new concerns, questions, or BP outside discussed parameters . Checks BP and records as discussed . Follows a low sodium diet/DASH diet . Daily weight monitoring  Initial goal documentation         Plan:  RNCM will follow up by telephone visit on 05/13/20 @ 10:30  [name] RN, BSN [place]  [facility] RN Care Coordinator

## 2020-04-04 NOTE — Patient Instructions (Signed)
Visit Information  Susan Hays was given information about Medicaid Managed Care team care coordination services as a part of their Healthy [facility] Medicaid benefit. Sharrie Rothman verbally consented to engagement with the [facility] Managed Care team.   For questions related to your Healthy [facility] health plan, please call: [phone] or visit the homepage here: [email or web address]  If you would like to schedule transportation through your Healthy [facility] plan, please call the following number at least 2 days in advance of your appointment: [phone]  Goals Addressed              This Visit's Progress   .  "I want to be healthy and improve my blood pressure" (pt-stated)        CARE PLAN ENTRY Medicaid Managed Care (see longtitudinal plan of care for additional care plan information)  Objective:  . Last practice recorded BP readings:  BP Readings from Last 3 Encounters:  03/30/20 (!) 190/83  03/04/20 120/63  02/29/20 (!) 252/121   . Patient reports blood pressure taken at home today 140/85 and weight 218  Current Barriers:  Marland Kitchen Knowledge Deficits related to basic understanding of hypertension pathophysiology and self care management. Patient had recent admission for uncontrolled high blood pressure.  Case Manager Clinical Goal(s):  Marland Kitchen Over the next 45 days, patient will verbalize understanding of plan for hypertension management . Over the next 45 days, patient will attend all scheduled medical appointments: PCP on 05/10/20 . Over the next 45 days, patient will demonstrate improved adherence to prescribed treatment plan for hypertension as evidenced by taking all medications as prescribed, monitoring and recording blood pressure as directed, adhering to low sodium/DASH diet, and monitoring daily weights  Interventions:  . Evaluation of current treatment plan related to hypertension self management and patient's adherence to plan as  established by provider. . Reviewed medications with patient and discussed importance of compliance . Discussed plans with patient for ongoing care management follow up and provided patient with direct contact information for care management team . Advised patient, providing education and rationale, to monitor blood pressure daily and record, calling PCP for findings outside established parameters.  . Reviewed scheduled/upcoming provider appointments including: PCP on 05/10/20  Patient Self Care Activities:  . Self administers medications as prescribed . Attends all scheduled provider appointments . Calls provider office for new concerns, questions, or BP outside discussed parameters . Checks BP and records as discussed . Follows a low sodium diet/DASH diet . Daily weight monitoring  Initial goal documentation         Please see education materials related to hypertension provided by MyChart link.    Hypertension, Adult Hypertension is another name for high blood pressure. High blood pressure forces your heart to work harder to pump blood. This can cause problems over time. There are two numbers in a blood pressure reading. There is a top number (systolic) over a bottom number (diastolic). It is best to have a blood pressure that is below 120/80. Healthy choices can help lower your blood pressure, or you may need medicine to help lower it. What are the causes? The cause of this condition is not known. Some conditions may be related to high blood pressure. What increases the risk?  Smoking.  Having type 2 diabetes mellitus, high cholesterol, or both.  Not getting enough exercise or physical activity.  Being overweight.  Having too much fat, sugar, calories, or salt (sodium) in your diet.  Drinking too much alcohol.  Having long-term (  chronic) kidney disease.  Having a family history of high blood pressure.  Age. Risk increases with age.  Race. You may be at higher risk  if you are African American.  Gender. Men are at higher risk than women before age 40. After age 82, women are at higher risk than men.  Having obstructive sleep apnea.  Stress. What are the signs or symptoms?  High blood pressure may not cause symptoms. Very high blood pressure (hypertensive crisis) may cause: ? Headache. ? Feelings of worry or nervousness (anxiety). ? Shortness of breath. ? Nosebleed. ? A feeling of being sick to your stomach (nausea). ? Throwing up (vomiting). ? Changes in how you see. ? Very bad chest pain. ? Seizures. How is this treated?  This condition is treated by making healthy lifestyle changes, such as: ? Eating healthy foods. ? Exercising more. ? Drinking less alcohol.  Your health care provider may prescribe medicine if lifestyle changes are not enough to get your blood pressure under control, and if: ? Your top number is above 130. ? Your bottom number is above 80.  Your personal target blood pressure may vary. Follow these instructions at home: Eating and drinking   If told, follow the DASH eating plan. To follow this plan: ? Fill one half of your plate at each meal with fruits and vegetables. ? Fill one fourth of your plate at each meal with whole grains. Whole grains include whole-wheat pasta, brown rice, and whole-grain bread. ? Eat or drink low-fat dairy products, such as skim milk or low-fat yogurt. ? Fill one fourth of your plate at each meal with low-fat (lean) proteins. Low-fat proteins include fish, chicken without skin, eggs, beans, and tofu. ? Avoid fatty meat, cured and processed meat, or chicken with skin. ? Avoid pre-made or processed food.  Eat less than 1,500 mg of salt each day.  Do not drink alcohol if: ? Your doctor tells you not to drink. ? You are pregnant, may be pregnant, or are planning to become pregnant.  If you drink alcohol: ? Limit how much you use to:  0-1 drink a day for women.  0-2 drinks a day for  men. ? Be aware of how much alcohol is in your drink. In the [place] one drink equals one 12 oz bottle of beer (355 mL), one 5 oz glass of wine (148 mL), or one 1 oz glass of hard liquor (44 mL). Lifestyle   Work with your doctor to stay at a healthy weight or to lose weight. Ask your doctor what the best weight is for you.  Get at least 30 minutes of exercise most days of the week. This may include walking, swimming, or biking.  Get at least 30 minutes of exercise that strengthens your muscles (resistance exercise) at least 3 days a week. This may include lifting weights or doing Pilates.  Do not use any products that contain nicotine or tobacco, such as cigarettes, e-cigarettes, and chewing tobacco. If you need help quitting, ask your doctor.  Check your blood pressure at home as told by your doctor.  Keep all follow-up visits as told by your doctor. This is important. Medicines  Take over-the-counter and prescription medicines only as told by your doctor. Follow directions carefully.  Do not skip doses of blood pressure medicine. The medicine does not work as well if you skip doses. Skipping doses also puts you at risk for problems.  Ask your doctor about side effects or reactions to  medicines that you should watch for. Contact a doctor if you:  Think you are having a reaction to the medicine you are taking.  Have headaches that keep coming back (recurring).  Feel dizzy.  Have swelling in your ankles.  Have trouble with your vision. Get help right away if you:  Get a very bad headache.  Start to feel mixed up (confused).  Feel weak or numb.  Feel faint.  Have very bad pain in your: ? Chest. ? Belly (abdomen).  Throw up more than once.  Have trouble breathing. Summary  Hypertension is another name for high blood pressure.  High blood pressure forces your heart to work harder to pump blood.  For most people, a normal blood pressure is less than  120/80.  Making healthy choices can help lower blood pressure. If your blood pressure does not get lower with healthy choices, you may need to take medicine. This information is not intended to replace advice given to you by your health care provider. Make sure you discuss any questions you have with your health care provider. Document Revised: 02/05/2018 Document Reviewed: 02/05/2018 Elsevier Patient Education  2020 [employer].   Patient verbalizes understanding of instructions provided today.   The patient has been provided with contact information for the Managed Medicaid care management team and has been advised to call with any health related questions or concerns.  Telephone follow up appointment with Managed Medicaid care management team member scheduled for:05/13/20 @ 10:30am  [name] RN, BSN [place]  [facility]

## 2020-04-06 ENCOUNTER — Encounter: Payer: Self-pay | Admitting: General Practice

## 2020-05-10 ENCOUNTER — Ambulatory Visit: Payer: Medicaid Other | Admitting: Family Medicine

## 2020-05-13 ENCOUNTER — Other Ambulatory Visit: Payer: Self-pay

## 2020-05-13 ENCOUNTER — Other Ambulatory Visit: Payer: Self-pay | Admitting: *Deleted

## 2020-05-13 NOTE — Patient Instructions (Signed)
Visit Information  Susan Hays was given information about Medicaid Managed Care team care coordination services as a part of their Healthy [facility] Medicaid benefit. Susan Hays verbally consented to engagement with the [facility] Managed Care team.   For questions related to your Healthy [facility] health plan, please call: [phone] or visit the homepage here: [email or web address]  If you would like to schedule transportation through your Healthy [facility] plan, please call the following number at least 2 days in advance of your appointment: [phone]  Goals Addressed              This Visit's Progress   .  "I want to be healthy and improve my blood pressure" (pt-stated)        CARE PLAN ENTRY Medicaid Managed Care (see longtitudinal plan of care for additional care plan information)  Objective:  . Last practice recorded BP readings:  BP Readings from Last 3 Encounters:  03/30/20 (!) 190/83  03/04/20 120/63  02/29/20 (!) 252/121   . Patient reports blood pressure taken at home this week 142/60's and weight 214  Current Barriers:  Marland Kitchen Knowledge Deficits related to basic understanding of hypertension pathophysiology and self care management. Patient had recent admission for uncontrolled high blood pressure.  Case Manager Clinical Goal(s):  Marland Kitchen Over the next 45 days, patient will verbalize understanding of plan for hypertension management-Met- Susan Hays has and takes all of her prescribed medications and is maintaining a heart healthy diet . Over the next 45 days, patient will attend all scheduled medical appointments: PCP on 07/04/20. Susan Hays had to reschedule her last appointment with her PCP due to her work schedule . Over the next 45 days, patient will demonstrate improved adherence to prescribed treatment plan for hypertension as evidenced by taking all medications as prescribed, monitoring and recording blood pressure as  directed, adhering to low sodium/DASH diet, and monitoring daily weights-Met  Interventions:  . Evaluation of current treatment plan related to hypertension self management and patient's adherence to plan as established by provider. . Reviewed medications with patient and discussed importance of compliance . Discussed plans with patient for ongoing care management follow up and provided patient with direct contact information for care management team . Advised patient, providing education and rationale, to monitor blood pressure daily and record, calling PCP for findings outside established parameters.  . Reviewed scheduled/upcoming provider appointments including: PCP on 07/04/20  Patient Self Care Activities:  . Self administers medications as prescribed . Attends all scheduled provider appointments . Calls provider office for new concerns, questions, or BP outside discussed parameters . Checks BP and records as discussed . Follows a low sodium diet/DASH diet . Daily weight monitoring  Please see past updates related to this goal by clicking on the "Past Updates" button in the selected goal          Telephone follow up appointment with Managed Medicaid care management team member scheduled for:07/18/20 @ 11:30am  Susan Joiner RN, BSN [place]  [employer] manager

## 2020-05-13 NOTE — Patient Outreach (Signed)
Care Coordination - Case Manager  05/13/2020  Faustine Tates [facility] 09-29-1970 [id number]  Subjective:  Susan Hays is an 49 y.o. year old female who is a primary patient of Susan Rakes, Hays.  [name] was given information about Medicaid Managed Care team care coordination services today. Susan Hays agreed to services and verbal consent obtained  Review of patient status, laboratory and other test data was performed as part of evaluation for provision of services.  SDOH: SDOH Screenings   Alcohol Screen:   . Last Alcohol Screening Score (AUDIT): Not on file  Depression (PHQ2-9): Low Risk   . PHQ-2 Score: 0  Financial Resource Strain:   . Difficulty of Paying Living Expenses: Not on file  Food Insecurity:   . Worried About Charity fundraiser in the Last Year: Not on file  . Ran Out of Food in the Last Year: Not on file  Tobacco Use: Low Risk   . Smoking Tobacco Use: Never Smoker  . Smokeless Tobacco Use: Never Used  Transportation Needs: No Transportation Needs  . Lack of Transportation (Medical): No  . Lack of Transportation (Non-Medical): No     Objective:    No Known Allergies  Medications:    Medications Reviewed Today    Reviewed by Melissa Montane, RN (Registered Nurse) on 05/13/20 at 1021  Med List Status: <None>  Medication Order Taking? Sig Documenting Provider Last Dose Status Informant  acetaminophen (TYLENOL) 500 MG tablet [id number] Yes Take 1,000 mg by mouth every 6 (six) hours as needed for moderate pain. Provider, Historical, Hays Taking Active Self  amLODipine (NORVASC) 5 MG tablet [id number] Yes Take 1 tablet (5 mg total) by mouth daily. Susan Rakes, Hays Taking Active   aspirin-acetaminophen-caffeine Jearld Adjutant EXTRA STRENGTH) [phone] MG tablet [id number] Yes Take 1-2 tablets by mouth every 6 (six) hours as needed for headache or migraine. Provider, Historical, Hays Taking Active   carvedilol (COREG) 12.5 MG tablet [id number] Yes Take 1  tablet (12.5 mg total) by mouth 2 (two) times daily with a meal. Susan Rakes, Hays Taking Active   dapagliflozin propanediol (FARXIGA) 5 MG TABS tablet [id number] Yes Take 1 tablet (5 mg total) by mouth daily before breakfast. Susan Rakes, Hays Taking Active   furosemide (LASIX) 20 MG tablet [id number] Yes Take 1 tablet (20 mg total) by mouth daily. Susan Rakes, Hays Taking Active   hydrALAZINE (APRESOLINE) 50 MG tablet [id number] No Take 1 tablet (50 mg total) by mouth 3 (three) times daily.  Patient not taking: Reported on 04/04/2020   Susan Lipps, Hays Not Taking Active   isosorbide mononitrate (IMDUR) 60 MG 24 hr tablet [id number] Yes Take 1 tablet (60 mg total) by mouth at bedtime. Susan Rakes, Hays Taking Active   magnesium oxide (MAG-OX) 400 (241.3 Mg) MG tablet [id number] Yes Take 1 tablet (400 mg total) by mouth 2 (two) times daily. Susan Hays Taking Active   sacubitril-valsartan (ENTRESTO) 49-51 MG [id number] Yes Take 1 tablet by mouth 2 (two) times daily. Susan Rakes, Hays Taking Active   spironolactone (ALDACTONE) 25 MG tablet [id number] Yes Take 1 tablet (25 mg total) by mouth at bedtime. Susan Rakes, Hays Taking Active           Assessment:   Goals Addressed              This Visit's Progress   .  "I want to be healthy and improve my blood pressure" (pt-stated)  CARE PLAN ENTRY Medicaid Managed Care (see longtitudinal plan of care for additional care plan information)  Objective:  . Last practice recorded BP readings:  BP Readings from Last 3 Encounters:  03/30/20 (!) 190/83  03/04/20 120/63  02/29/20 (!) 252/121   . Patient reports blood pressure taken at home this week 142/60's and weight 214  Current Barriers:  Marland Kitchen Knowledge Deficits related to basic understanding of hypertension pathophysiology and self care management. Patient had recent admission for uncontrolled high blood pressure.  Case Manager Clinical Goal(s):  Marland Kitchen Over the next 45  days, patient will verbalize understanding of plan for hypertension management-Met- [name] has and takes all of her prescribed medications and is maintaining a heart healthy diet . Over the next 45 days, patient will attend all scheduled medical appointments: PCP on 07/04/20. [name] had to reschedule her last appointment with her PCP due to her work schedule . Over the next 45 days, patient will demonstrate improved adherence to prescribed treatment plan for hypertension as evidenced by taking all medications as prescribed, monitoring and recording blood pressure as directed, adhering to low sodium/DASH diet, and monitoring daily weights-Met  Interventions:  . Evaluation of current treatment plan related to hypertension self management and patient's adherence to plan as established by provider. . Reviewed medications with patient and discussed importance of compliance . Discussed plans with patient for ongoing care management follow up and provided patient with direct contact information for care management team . Advised patient, providing education and rationale, to monitor blood pressure daily and record, calling PCP for findings outside established parameters.  . Reviewed scheduled/upcoming provider appointments including: PCP on 07/04/20  Patient Self Care Activities:  . Self administers medications as prescribed . Attends all scheduled provider appointments . Calls provider office for new concerns, questions, or BP outside discussed parameters . Checks BP and records as discussed . Follows a low sodium diet/DASH diet . Daily weight monitoring  Please see past updates related to this goal by clicking on the "Past Updates" button in the selected goal          Plan: Patient will attend her scheduled appointment with PCP on 07/04/20, RNCM will follow up with patient with a telephone call on 07/18/20.  [name] RN, BSN [place]  [employer] surveyor

## 2020-07-04 ENCOUNTER — Ambulatory Visit: Payer: Medicaid Other | Admitting: Family Medicine

## 2020-07-18 ENCOUNTER — Other Ambulatory Visit: Payer: Self-pay | Admitting: *Deleted

## 2020-07-18 NOTE — Patient Instructions (Signed)
Visit Information  Ms. Sharrie Rothman  - as a part of your Medicaid benefit, you are eligible for care management and care coordination services at no cost or copay. I was unable to reach you by phone today but would be happy to help you with your health related needs. Please feel free to call me @ [phone].   A member of the Managed Medicaid care management team will reach out to you again over the next 7-14 days.   [name] RN, BSN [place]  [employer]ist

## 2020-07-18 NOTE — Patient Outreach (Signed)
Care Coordination  07/18/2020  Audrianna Driskill [facility] July 16, 1970 [id number]    Medicaid Managed Care   Unsuccessful Outreach Note  07/18/2020 Name: BRELYNN WHELLER MRN: [id number] DOB: [birth year]  Referred by: Hoy Register, MD Reason for referral : High Risk Managed Medicaid (Unsuccessful RNCM outreach)   An unsuccessful telephone outreach was attempted today. The patient was referred to the case management team for assistance with care management and care coordination.   Follow Up Plan: The care management team will reach out to the patient again over the next 7-14 days.   [name] RN, BSN [place]  [employer]ist

## 2020-07-19 ENCOUNTER — Other Ambulatory Visit: Payer: Self-pay | Admitting: Family Medicine

## 2020-07-20 ENCOUNTER — Telehealth: Payer: Self-pay | Admitting: Family Medicine

## 2020-07-20 NOTE — Telephone Encounter (Signed)
Attempted to reach Susan Hays to day to get her phone appt rescheduled. She missed her appt on 07/18/20 with the [facility] with the Managed Medicaid team. I was unable to leave a message for her as her voicemail box was full. I will try to reach her again in the next 7-14 days.

## 2020-09-06 ENCOUNTER — Other Ambulatory Visit: Payer: Self-pay | Admitting: Family Medicine

## 2020-09-06 DIAGNOSIS — R7303 Prediabetes: Secondary | ICD-10-CM

## 2020-09-06 NOTE — Telephone Encounter (Signed)
Requested Prescriptions  Pending Prescriptions Disp Refills  . FARXIGA 5 MG TABS tablet [Pharmacy Med Name: FARXIGA 5 MG TABLET] 90 tablet 0    Sig: TAKE 1 TABLET BY MOUTH DAILY BEFORE BREAKFAST.     Endocrinology:  Diabetes - SGLT2 Inhibitors Failed - 09/06/2020  5:03 PM      Failed - Cr in normal range and within 360 days    Creatinine, Ser  Date Value Ref Range Status  03/04/2020 1.02 (H) 0.44 - 1.00 mg/dL Final         Failed - LDL in normal range and within 360 days    No results found for: LDLCALC, LDLC, HIRISKLDL, POCLDL, LDLDIRECT, REALLDLC, TOTLDLC       Passed - HBA1C is between 0 and 7.9 and within 180 days    HbA1c, POC (controlled diabetic range)  Date Value Ref Range Status  03/30/2020 6.2 0.0 - 7.0 % Final         Passed - AA eGFR in normal range and within 360 days    GFR calc Af Amer  Date Value Ref Range Status  03/04/2020 >60 >60 mL/min Final   GFR calc non Af Amer  Date Value Ref Range Status  03/04/2020 >60 >60 mL/min Final         Passed - Valid encounter within last 6 months    Recent Outpatient Visits          5 months ago Prediabetes   [place], [place], MD

## 2020-09-08 ENCOUNTER — Telehealth: Payer: Self-pay | Admitting: Family Medicine

## 2020-09-08 NOTE — Telephone Encounter (Signed)
2nd attempt to reach Susan Hays to get her rescheduled with the [facility] on the MM team. Her VM was full so I was not able to leave her a message.

## 2020-09-30 ENCOUNTER — Telehealth: Payer: Self-pay | Admitting: Family Medicine

## 2020-09-30 NOTE — Telephone Encounter (Signed)
Today was 3rd attempt to reach Susan Hays to get her rescheduled with the MM RNCM for a phone visit. The VM was not set up.

## 2020-10-06 ENCOUNTER — Other Ambulatory Visit: Payer: Self-pay

## 2020-10-06 ENCOUNTER — Other Ambulatory Visit: Payer: Self-pay | Admitting: *Deleted

## 2020-10-06 NOTE — Patient Outreach (Addendum)
Care Coordination  10/06/2020  Ariday Brinker [facility] [birth year] [id number]    Medicaid Managed Care   Unsuccessful Outreach Note  10/06/2020 Name: AYSSA BENTIVEGNA MRN: [id number] DOB: Dec 08, 1970  Referred by: Hoy Register, MD Reason for referral : Case Closure (Unable to maintain contact)   Third unsuccessful telephone outreach was attempted today. The patient was referred to the case management team for assistance with care management and care coordination. The patient's primary care provider has been notified of our unsuccessful attempts to make or maintain contact with the patient. The care management team is pleased to engage with this patient at any time in the future should he/she be interested in assistance from the care management team.   Follow Up Plan: We have been unable to make contact with the patient for follow up. The care management team is available to follow up with the patient after provider conversation with the patient regarding recommendation for care management engagement and subsequent re-referral to the care management team.   [name] RN, BSN [place]  [facility] RN Care Coordinator

## 2020-10-12 ENCOUNTER — Other Ambulatory Visit: Payer: Self-pay | Admitting: Family Medicine

## 2020-10-12 NOTE — Telephone Encounter (Signed)
Requested medication (s) are due for refill today: yes  Requested medication (s) are on the active medication list:  yes  Last refill:  07/20/2020  Future visit scheduled: no  Notes to clinic:  overdue for follow up appt Patient has not called back to schedule   Requested Prescriptions  Pending Prescriptions Disp Refills   carvedilol (COREG) 12.5 MG tablet [Pharmacy Med Name: CARVEDILOL 12.5 MG TABLET] 180 tablet 0    Sig: TAKE 1 TABLET (12.5 MG TOTAL) BY MOUTH 2 (TWO) TIMES DAILY WITH A MEAL.      Cardiovascular:  Beta Blockers Failed - 10/12/2020  9:32 AM      Failed - Last BP in normal range    BP Readings from Last 1 Encounters:  03/30/20 (!) 190/83          Failed - Valid encounter within last 6 months    Recent Outpatient Visits           6 months ago Prediabetes   [facility] [place] [facility] [place], [place], MD                Passed - Last Heart Rate in normal range    Pulse Readings from Last 1 Encounters:  03/30/20 96

## 2020-10-16 ENCOUNTER — Other Ambulatory Visit: Payer: Self-pay | Admitting: Family Medicine

## 2020-10-16 NOTE — Telephone Encounter (Signed)
Requested medication (s) are due for refill today: yes  Requested medication (s) are on the active medication list: yes  Last refill:  07/20/20  Future visit scheduled: no  Notes to clinic:  Called pt but unable to LM - VM full   Requested Prescriptions  Pending Prescriptions Disp Refills   spironolactone (ALDACTONE) 25 MG tablet [Pharmacy Med Name: SPIRONOLACTONE 25 MG TABLET] 90 tablet 0    Sig: TAKE 1 TABLET BY MOUTH EVERYDAY AT BEDTIME      Cardiovascular: Diuretics - Aldosterone Antagonist Failed - 10/16/2020 11:35 AM      Failed - Cr in normal range and within 360 days    Creatinine, Ser  Date Value Ref Range Status  03/04/2020 1.02 (H) 0.44 - 1.00 mg/dL Final          Failed - Last BP in normal range    BP Readings from Last 1 Encounters:  03/30/20 (!) 190/83          Failed - Valid encounter within last 6 months    Recent Outpatient Visits           6 months ago Prediabetes   [place] [facility] [place], [place], MD                Passed - K in normal range and within 360 days    Potassium  Date Value Ref Range Status  03/04/2020 3.9 3.5 - 5.1 mmol/L Final          Passed - Na in normal range and within 360 days    Sodium  Date Value Ref Range Status  03/04/2020 135 135 - 145 mmol/L Final            isosorbide mononitrate (IMDUR) 60 MG 24 hr tablet [Pharmacy Med Name: ISOSORBIDE MONONIT ER 60 MG TB] 90 tablet 0    Sig: TAKE 1 TABLET (60 MG TOTAL) BY MOUTH AT BEDTIME.      Cardiovascular:  Nitrates Failed - 10/16/2020 11:35 AM      Failed - Last BP in normal range    BP Readings from Last 1 Encounters:  03/30/20 (!) 190/83          Passed - Last Heart Rate in normal range    Pulse Readings from Last 1 Encounters:  03/30/20 96          Passed - Valid encounter within last 12 months    Recent Outpatient Visits           6 months ago Prediabetes   [place] [facility] Hoy Register, MD

## 2020-10-16 NOTE — Telephone Encounter (Signed)
Requested medication (s) are due for refill today: yes  Requested medication (s) are on the active medication list: yes  Last refill:  07/20/20  Future visit scheduled: no  Notes to clinic:  called pt but unable to LM Vm is full. No other numbers in chart.   Requested Prescriptions  Pending Prescriptions Disp Refills   furosemide (LASIX) 20 MG tablet [Pharmacy Med Name: FUROSEMIDE 20 MG TABLET] 90 tablet 0    Sig: TAKE 1 TABLET BY MOUTH EVERY DAY      Cardiovascular:  Diuretics - Loop Failed - 10/16/2020 11:40 AM      Failed - Ca in normal range and within 360 days    Calcium  Date Value Ref Range Status  03/04/2020 8.0 (L) 8.9 - 10.3 mg/dL Final          Failed - Cr in normal range and within 360 days    Creatinine, Ser  Date Value Ref Range Status  03/04/2020 1.02 (H) 0.44 - 1.00 mg/dL Final          Failed - Last BP in normal range    BP Readings from Last 1 Encounters:  03/30/20 (!) 190/83          Failed - Valid encounter within last 6 months    Recent Outpatient Visits           6 months ago Prediabetes   [place] [facility] [place], [place], MD                Passed - K in normal range and within 360 days    Potassium  Date Value Ref Range Status  03/04/2020 3.9 3.5 - 5.1 mmol/L Final          Passed - Na in normal range and within 360 days    Sodium  Date Value Ref Range Status  03/04/2020 135 135 - 145 mmol/L Final

## 2020-11-16 ENCOUNTER — Other Ambulatory Visit: Payer: Self-pay | Admitting: Family Medicine

## 2020-11-16 ENCOUNTER — Telehealth: Payer: Self-pay | Admitting: Family Medicine

## 2020-11-16 MED ORDER — AMLODIPINE BESYLATE 5 MG PO TABS
5.0000 mg | ORAL_TABLET | Freq: Every day | ORAL | 0 refills | Status: DC
Start: 2020-11-16 — End: 2020-11-16

## 2020-11-16 MED ORDER — CARVEDILOL 12.5 MG PO TABS
12.5000 mg | ORAL_TABLET | Freq: Two times a day (BID) | ORAL | 0 refills | Status: DC
Start: 2020-11-16 — End: 2020-12-21

## 2020-11-16 NOTE — Telephone Encounter (Signed)
Copied from CRM [id number]. Topic: General - Other >> Nov 14, 2020  4:17 PM Aretta Nip wrote: carvedilol (COREG) 12.5 MG tablet 180 tablet 0 07/20/2020   Sig - Route: TAKE 1 TABLET (12.5 MG TOTAL) BY MOUTH 2 (TWO) TIMES DAILY WITH A MEAL. - Oral  Sent to pharmacy as: carvedilol (COREG) 12.5 MG tablet  amLODipine (NORVASC) 5 MG tablet 60 tablet 3 03/30/2020   Sig - Route: Take 1 tablet (5 mg total) by mouth daily. - Oral  Sent to pharmacy as: amLODipine (NORVASC) 5 MG tablet  [id number] [place], [place] - [address] [place] [address] Phone: [phone] Fax: [phone]  Pt has called office crying as states needs meds, pt made appt and promised to keep appt, request med refill  till ... next available appt 7/13  Please advise if courtesy refill appropriate.

## 2020-11-16 NOTE — Telephone Encounter (Signed)
Requested Prescriptions  Pending Prescriptions Disp Refills  . amLODipine (NORVASC) 5 MG tablet [Pharmacy Med Name: AMLODIPINE BESYLATE 5 MG TAB] 35 tablet 0    Sig: TAKE 1 TABLET BY MOUTH EVERY DAY     Cardiovascular:  Calcium Channel Blockers Failed - 11/16/2020  1:55 PM      Failed - Last BP in normal range    BP Readings from Last 1 Encounters:  03/30/20 (!) 190/83         Failed - Valid encounter within last 6 months    Recent Outpatient Visits          7 months ago Prediabetes   [place] [facility] Hoy Register, MD      Future Appointments            In 1 month [place], Marzella Schlein, PA-C [facility]

## 2020-11-16 NOTE — Telephone Encounter (Signed)
Rx sent for 1 month to last until 7/13 appt.

## 2020-11-17 NOTE — Telephone Encounter (Signed)
Pt callback and is aware rxs sent to pharm

## 2020-11-17 NOTE — Telephone Encounter (Signed)
Called patient to notify her that her script had been sent, no answer, and unable to LVM.

## 2020-12-18 ENCOUNTER — Other Ambulatory Visit: Payer: Self-pay | Admitting: Family Medicine

## 2020-12-18 NOTE — Telephone Encounter (Signed)
Requested medication (s) are due for refill today: courtesy refill given   Requested medication (s) are on the active medication list: yes   Last refill:  11/26/20 #35 0 refills  Future visit scheduled: yes in 3 days  Notes to clinic:  courtesy refill given. 11/26/20. Do you want to refill 30 day supply or review at appt in 3 days?     Requested Prescriptions  Pending Prescriptions Disp Refills   amLODipine (NORVASC) 5 MG tablet [Pharmacy Med Name: AMLODIPINE BESYLATE 5 MG TAB] 35 tablet 0    Sig: TAKE 1 TABLET BY MOUTH EVERY DAY      Cardiovascular:  Calcium Channel Blockers Failed - 12/18/2020 12:39 PM      Failed - Last BP in normal range    BP Readings from Last 1 Encounters:  03/30/20 (!) 190/83          Failed - Valid encounter within last 6 months    Recent Outpatient Visits           8 months ago Prediabetes   [place] [facility] Hoy Register, MD       Future Appointments             In 3 days [place], [place] [facility]

## 2020-12-21 ENCOUNTER — Encounter (INDEPENDENT_AMBULATORY_CARE_PROVIDER_SITE_OTHER): Payer: Self-pay

## 2020-12-21 ENCOUNTER — Encounter: Payer: Self-pay | Admitting: Physician Assistant

## 2020-12-21 ENCOUNTER — Ambulatory Visit: Payer: Medicaid Other | Attending: Physician Assistant | Admitting: Physician Assistant

## 2020-12-21 ENCOUNTER — Other Ambulatory Visit: Payer: Self-pay

## 2020-12-21 VITALS — BP 181/92 | HR 94 | Resp 16 | Ht 65.0 in | Wt 219.2 lb

## 2020-12-21 DIAGNOSIS — R7303 Prediabetes: Secondary | ICD-10-CM | POA: Diagnosis not present

## 2020-12-21 DIAGNOSIS — Z7984 Long term (current) use of oral hypoglycemic drugs: Secondary | ICD-10-CM | POA: Insufficient documentation

## 2020-12-21 DIAGNOSIS — I110 Hypertensive heart disease with heart failure: Secondary | ICD-10-CM | POA: Diagnosis not present

## 2020-12-21 DIAGNOSIS — I10 Essential (primary) hypertension: Secondary | ICD-10-CM | POA: Diagnosis not present

## 2020-12-21 DIAGNOSIS — Z79899 Other long term (current) drug therapy: Secondary | ICD-10-CM | POA: Insufficient documentation

## 2020-12-21 DIAGNOSIS — I5042 Chronic combined systolic (congestive) and diastolic (congestive) heart failure: Secondary | ICD-10-CM

## 2020-12-21 DIAGNOSIS — Z7901 Long term (current) use of anticoagulants: Secondary | ICD-10-CM | POA: Diagnosis not present

## 2020-12-21 DIAGNOSIS — Z7182 Exercise counseling: Secondary | ICD-10-CM | POA: Diagnosis not present

## 2020-12-21 MED ORDER — ISOSORBIDE MONONITRATE ER 60 MG PO TB24: 60.0000 mg | ORAL_TABLET | Freq: Every day | ORAL | 0 refills | Status: DC

## 2020-12-21 MED ORDER — CARVEDILOL 12.5 MG PO TABS: 12.5000 mg | ORAL_TABLET | Freq: Two times a day (BID) | ORAL | 3 refills | Status: DC

## 2020-12-21 MED ORDER — SACUBITRIL-VALSARTAN 49-51 MG PO TABS: 1.0000 | ORAL_TABLET | Freq: Two times a day (BID) | ORAL | 3 refills | Status: DC

## 2020-12-21 MED ORDER — AMLODIPINE BESYLATE 5 MG PO TABS: 5.0000 mg | ORAL_TABLET | Freq: Every day | ORAL | 4 refills | Status: DC

## 2020-12-21 MED ORDER — DAPAGLIFLOZIN PROPANEDIOL 5 MG PO TABS: 5.0000 mg | ORAL_TABLET | Freq: Every day | ORAL | 0 refills | Status: DC

## 2020-12-21 MED ORDER — FUROSEMIDE 20 MG PO TABS: 20.0000 mg | ORAL_TABLET | Freq: Every day | ORAL | 0 refills | Status: DC

## 2020-12-21 MED ORDER — SPIRONOLACTONE 25 MG PO TABS: ORAL_TABLET | ORAL | 0 refills | Status: DC

## 2020-12-21 NOTE — Progress Notes (Signed)
Patient ID: Susan Hays, female   DOB: 22-Oct-1970, 50 y.o.   MRN: [id number]   Susan Hays, is a 50 y.o. female  FUX:[id number]  [id number]  DOB - [birth year]  Subjective:  Chief Complaint and HPI: Susan Hays is a 50 y.o. female here today for med RF.  She denies any issues or concerns.  She has been out of her meds(most of them) for at least a week or more.  She(and her daughter) checks he BP regularly and says it is usu about 130-135/80-85 at home.  No CP/SOB/change in vision/dizziness.    ROS:   Constitutional:  No f/c, No night sweats, No unexplained weight loss. EENT:  No vision changes, No blurry vision, No hearing changes. No mouth, throat, or ear problems.  Respiratory: No cough, No SOB Cardiac: No CP, no palpitations GI:  No abd pain, No N/V/D. GU: No Urinary s/sx Musculoskeletal: No joint pain Neuro: No headache, no dizziness, no motor weakness.  Skin: No rash Endocrine:  No polydipsia. No polyuria.  Psych: Denies SI/HI  No problems updated.  ALLERGIES: No Known Allergies  PAST MEDICAL HISTORY: Past Medical History:  Diagnosis Date   Hypertension     MEDICATIONS AT HOME: Prior to Admission medications   Medication Sig Start Date End Date Taking? Authorizing Provider  acetaminophen (TYLENOL) 500 MG tablet Take 1,000 mg by mouth every 6 (six) hours as needed for moderate pain.    [provider]  amLODipine (NORVASC) 5 MG tablet Take 1 tablet (5 mg total) by mouth daily. 12/21/20   Anders Simmonds, PA-C  aspirin-acetaminophen-caffeine (EXCEDRIN EXTRA STRENGTH) [id number] MG tablet Take 1-2 tablets by mouth every 6 (six) hours as needed for headache or migraine.    [provider]  carvedilol (COREG) 12.5 MG tablet Take 1 tablet (12.5 mg total) by mouth 2 (two) times daily with a meal. 12/21/20   Feiga Nadel, Marzella Schlein, PA-C  dapagliflozin propanediol (FARXIGA) 5 MG TABS tablet Take 1 tablet (5 mg total) by mouth daily before breakfast.  12/21/20   Anders Simmonds, PA-C  furosemide (LASIX) 20 MG tablet Take 1 tablet (20 mg total) by mouth daily. 12/21/20   Anders Simmonds, PA-C  hydrALAZINE (APRESOLINE) 50 MG tablet Take 1 tablet (50 mg total) by mouth 3 (three) times daily. Patient not taking: Reported on 04/04/2020 03/04/20   Joycelyn Das, MD  isosorbide mononitrate (IMDUR) 60 MG 24 hr tablet Take 1 tablet (60 mg total) by mouth at bedtime. 12/21/20   Anders Simmonds, PA-C  magnesium oxide (MAG-OX) 400 (241.3 Mg) MG tablet Take 1 tablet (400 mg total) by mouth 2 (two) times daily. 03/04/20   [name], [name], MD  sacubitril-valsartan (ENTRESTO) 49-51 MG Take 1 tablet by mouth 2 (two) times daily. 12/21/20   Anders Simmonds, PA-C  spironolactone (ALDACTONE) 25 MG tablet TAKE 1 TABLET BY MOUTH EVERYDAY AT BEDTIME 12/21/20   Anders Simmonds, PA-C     Objective:  EXAM:   Vitals:   12/21/20 1350  BP: (!) 181/92  Pulse: 94  Resp: 16  SpO2: 100%  Weight: 219 lb 3.2 oz (99.4 kg)  Height: 5\' 5"  (1.651 m)    General appearance : A&OX3. NAD. Non-toxic-appearing HEENT: Atraumatic and Normocephalic.  PERRLA. EOM intact.  Chest/Lungs:  Breathing-non-labored, Good air entry bilaterally, breath sounds normal without rales, rhonchi, or wheezing  CVS: S1 S2 regular, +stable systolic murmur, gallops, rubs  Extremities: Bilateral Lower Ext shows minimal edema, both legs are warm to  touch with = pulse throughout Neurology:  CN II-XII grossly intact, Non focal.   Psych:  TP linear. J/I fair. Words run together at times in her speech. Appropriate eye contact and blunted affect.  Skin:  No Rash  Data Review Lab Results  Component Value Date   HGBA1C 6.2 03/30/2020     Assessment & Plan   1. Prediabetes I have had a lengthy discussion and provided education about insulin resistance and the intake of too much sugar/refined carbohydrates.  I have advised the patient to work at a goal of eliminating sugary drinks, candy,  desserts, sweets, refined sugars, processed foods, and white carbohydrates.  The patient expresses understanding.  - Comprehensive metabolic panel - dapagliflozin propanediol (FARXIGA) 5 MG TABS tablet; Take 1 tablet (5 mg total) by mouth daily before breakfast.  Dispense: 90 tablet; Refill: 0 - Hemoglobin A1c  2. Essential hypertension Not at goal in office but reasonable control based on home numbers - Comprehensive metabolic panel - CBC with Differential/Platelet - carvedilol (COREG) 12.5 MG tablet; Take 1 tablet (12.5 mg total) by mouth 2 (two) times daily with a meal.  Dispense: 60 tablet; Refill: 3 - amLODipine (NORVASC) 5 MG tablet; Take 1 tablet (5 mg total) by mouth daily.  Dispense: 30 tablet; Refill: 4 - isosorbide mononitrate (IMDUR) 60 MG 24 hr tablet; Take 1 tablet (60 mg total) by mouth at bedtime.  Dispense: 90 tablet; Refill: 0 - sacubitril-valsartan (ENTRESTO) 49-51 MG; Take 1 tablet by mouth 2 (two) times daily.  Dispense: 60 tablet; Refill: 3  3. Chronic combined systolic (congestive) and diastolic (congestive) heart failure (HCC) - carvedilol (COREG) 12.5 MG tablet; Take 1 tablet (12.5 mg total) by mouth 2 (two) times daily with a meal.  Dispense: 60 tablet; Refill: 3 - dapagliflozin propanediol (FARXIGA) 5 MG TABS tablet; Take 1 tablet (5 mg total) by mouth daily before breakfast.  Dispense: 90 tablet; Refill: 0 - isosorbide mononitrate (IMDUR) 60 MG 24 hr tablet; Take 1 tablet (60 mg total) by mouth at bedtime.  Dispense: 90 tablet; Refill: 0 - sacubitril-valsartan (ENTRESTO) 49-51 MG; Take 1 tablet by mouth 2 (two) times daily.  Dispense: 60 tablet; Refill: 3 - spironolactone (ALDACTONE) 25 MG tablet; TAKE 1 TABLET BY MOUTH EVERYDAY AT BEDTIME  Dispense: 90 tablet; Refill: 0  Patient have been counseled extensively about nutrition and exercise  Return in about 3 months (around 03/23/2021) for PCP; chronic conditions.  The patient was given clear instructions to go to  ER or return to medical center if symptoms don't improve, worsen or new problems develop. The patient verbalized understanding. The patient was told to call to get lab results if they haven't heard anything in the next week.     Georgian Co, PA-C [facility] [place], [place] [phone]   12/21/2020, 2:09 PM

## 2020-12-21 NOTE — Patient Instructions (Signed)
Resume all meds Hypertension, Adult Hypertension is another name for high blood pressure. High blood pressure forces your heart to work harder to pump blood. This can cause problems overtime. There are two numbers in a blood pressure reading. There is a top number (systolic) over a bottom number (diastolic). It is best to have a blood pressure that is below 120/80. Healthy choicescan help lower your blood pressure, or you may need medicine to help lower it. What are the causes? The cause of this condition is not known. Some conditions may be related tohigh blood pressure. What increases the risk? Smoking. Having type 2 diabetes mellitus, high cholesterol, or both. Not getting enough exercise or physical activity. Being overweight. Having too much fat, sugar, calories, or salt (sodium) in your diet. Drinking too much alcohol. Having long-term (chronic) kidney disease. Having a family history of high blood pressure. Age. Risk increases with age. Race. You may be at higher risk if you are African American. Gender. Men are at higher risk than women before age 51. After age 72, women are at higher risk than men. Having obstructive sleep apnea. Stress. What are the signs or symptoms? High blood pressure may not cause symptoms. Very high blood pressure (hypertensive crisis) may cause: Headache. Feelings of worry or nervousness (anxiety). Shortness of breath. Nosebleed. A feeling of being sick to your stomach (nausea). Throwing up (vomiting). Changes in how you see. Very bad chest pain. Seizures. How is this treated? This condition is treated by making healthy lifestyle changes, such as: Eating healthy foods. Exercising more. Drinking less alcohol. Your health care provider may prescribe medicine if lifestyle changes are not enough to get your blood pressure under control, and if: Your top number is above 130. Your bottom number is above 80. Your personal target blood pressure may  vary. Follow these instructions at home: Eating and drinking  If told, follow the DASH eating plan. To follow this plan: Fill one half of your plate at each meal with fruits and vegetables. Fill one fourth of your plate at each meal with whole grains. Whole grains include whole-wheat pasta, brown rice, and whole-grain bread. Eat or drink low-fat dairy products, such as skim milk or low-fat yogurt. Fill one fourth of your plate at each meal with low-fat (lean) proteins. Low-fat proteins include fish, chicken without skin, eggs, beans, and tofu. Avoid fatty meat, cured and processed meat, or chicken with skin. Avoid pre-made or processed food. Eat less than 1,500 mg of salt each day. Do not drink alcohol if: Your doctor tells you not to drink. You are pregnant, may be pregnant, or are planning to become pregnant. If you drink alcohol: Limit how much you use to: 0-1 drink a day for women. 0-2 drinks a day for men. Be aware of how much alcohol is in your drink. In the [place] one drink equals one 12 oz bottle of beer (355 mL), one 5 oz glass of wine (148 mL), or one 1 oz glass of hard liquor (44 mL).  Lifestyle  Work with your doctor to stay at a healthy weight or to lose weight. Ask your doctor what the best weight is for you. Get at least 30 minutes of exercise most days of the week. This may include walking, swimming, or biking. Get at least 30 minutes of exercise that strengthens your muscles (resistance exercise) at least 3 days a week. This may include lifting weights or doing Pilates. Do not use any products that contain nicotine or tobacco,  such as cigarettes, e-cigarettes, and chewing tobacco. If you need help quitting, ask your doctor. Check your blood pressure at home as told by your doctor. Keep all follow-up visits as told by your doctor. This is important.  Medicines Take over-the-counter and prescription medicines only as told by your doctor. Follow directions carefully. Do  not skip doses of blood pressure medicine. The medicine does not work as well if you skip doses. Skipping doses also puts you at risk for problems. Ask your doctor about side effects or reactions to medicines that you should watch for. Contact a doctor if you: Think you are having a reaction to the medicine you are taking. Have headaches that keep coming back (recurring). Feel dizzy. Have swelling in your ankles. Have trouble with your vision. Get help right away if you: Get a very bad headache. Start to feel mixed up (confused). Feel weak or numb. Feel faint. Have very bad pain in your: Chest. Belly (abdomen). Throw up more than once. Have trouble breathing. Summary Hypertension is another name for high blood pressure. High blood pressure forces your heart to work harder to pump blood. For most people, a normal blood pressure is less than 120/80. Making healthy choices can help lower blood pressure. If your blood pressure does not get lower with healthy choices, you may need to take medicine. This information is not intended to replace advice given to you by your health care provider. Make sure you discuss any questions you have with your healthcare provider. Document Revised: 02/05/2018 Document Reviewed: 02/05/2018 Elsevier Patient Education  2022 [employer].

## 2020-12-22 ENCOUNTER — Other Ambulatory Visit: Payer: Self-pay | Admitting: Physician Assistant

## 2020-12-22 DIAGNOSIS — D649 Anemia, unspecified: Secondary | ICD-10-CM

## 2020-12-22 LAB — COMPREHENSIVE METABOLIC PANEL
ALT: 15 IU/L (ref 0–32)
AST: 24 IU/L (ref 0–40)
Albumin/Globulin Ratio: 1.2 (ref 1.2–2.2)
Albumin: 4.1 g/dL (ref 3.8–4.9)
Alkaline Phosphatase: 111 IU/L (ref 44–121)
BUN/Creatinine Ratio: 16 (ref 9–23)
BUN: 14 mg/dL (ref 6–24)
Bilirubin Total: <0.2 mg/dL (ref 0.0–1.2)
CO2: 26 mmol/L (ref 20–29)
Calcium: 9.2 mg/dL (ref 8.7–10.2)
Chloride: 96 mmol/L (ref 96–106)
Creatinine, Ser: 0.87 mg/dL (ref 0.57–1.00)
Globulin, Total: 3.5 g/dL (ref 1.5–4.5)
Glucose: 112 mg/dL — ABNORMAL HIGH (ref 65–99)
Potassium: 4.2 mmol/L (ref 3.5–5.2)
Sodium: 138 mmol/L (ref 134–144)
Total Protein: 7.6 g/dL (ref 6.0–8.5)
eGFR: 81 mL/min/{1.73_m2} (ref >59)

## 2020-12-22 LAB — CBC WITH DIFFERENTIAL/PLATELET
Basophils Absolute: 0.1 10*3/uL (ref 0.0–0.2)
Basos: 1 %
EOS (ABSOLUTE): 0.1 10*3/uL (ref 0.0–0.4)
Eos: 2 %
Hematocrit: 29.1 % — ABNORMAL LOW (ref 34.0–46.6)
Hemoglobin: 8.6 g/dL — ABNORMAL LOW (ref 11.1–15.9)
Immature Grans (Abs): 0 10*3/uL (ref 0.0–0.1)
Immature Granulocytes: 0 %
Lymphocytes Absolute: 1.6 10*3/uL (ref 0.7–3.1)
Lymphs: 20 %
MCH: 21.4 pg — ABNORMAL LOW (ref 26.6–33.0)
MCHC: 29.6 g/dL — ABNORMAL LOW (ref 31.5–35.7)
MCV: 73 fL — ABNORMAL LOW (ref 79–97)
Monocytes Absolute: 1 10*3/uL — ABNORMAL HIGH (ref 0.1–0.9)
Monocytes: 13 %
Neutrophils Absolute: 4.9 10*3/uL (ref 1.4–7.0)
Neutrophils: 64 %
Platelets: 333 10*3/uL (ref 150–450)
RBC: 4.01 x10E6/uL (ref 3.77–5.28)
RDW: 16 % — ABNORMAL HIGH (ref 11.7–15.4)
WBC: 7.7 10*3/uL (ref 3.4–10.8)

## 2020-12-22 LAB — HEMOGLOBIN A1C
Est. average glucose Bld gHb Est-mCnc: 126 mg/dL
Hgb A1c MFr Bld: 6 % — ABNORMAL HIGH (ref 4.8–5.6)

## 2020-12-22 MED ORDER — IRON (FERROUS SULFATE) 325 (65 FE) MG PO TABS: 325.0000 mg | ORAL_TABLET | Freq: Every day | ORAL | 4 refills | Status: DC

## 2021-03-13 ENCOUNTER — Ambulatory Visit: Payer: Medicaid Other | Attending: Family Medicine | Admitting: Family Medicine

## 2021-03-13 ENCOUNTER — Other Ambulatory Visit: Payer: Self-pay

## 2021-03-13 ENCOUNTER — Encounter: Payer: Self-pay | Admitting: Family Medicine

## 2021-03-13 VITALS — BP 182/87 | HR 75 | Ht 65.0 in | Wt 218.6 lb

## 2021-03-13 DIAGNOSIS — R7303 Prediabetes: Secondary | ICD-10-CM | POA: Diagnosis not present

## 2021-03-13 DIAGNOSIS — Z79899 Other long term (current) drug therapy: Secondary | ICD-10-CM | POA: Diagnosis not present

## 2021-03-13 DIAGNOSIS — M7542 Impingement syndrome of left shoulder: Secondary | ICD-10-CM | POA: Diagnosis not present

## 2021-03-13 DIAGNOSIS — Z7984 Long term (current) use of oral hypoglycemic drugs: Secondary | ICD-10-CM | POA: Diagnosis not present

## 2021-03-13 DIAGNOSIS — Z91128 Patient's intentional underdosing of medication regimen for other reason: Secondary | ICD-10-CM | POA: Insufficient documentation

## 2021-03-13 DIAGNOSIS — I5022 Chronic systolic (congestive) heart failure: Secondary | ICD-10-CM | POA: Diagnosis not present

## 2021-03-13 DIAGNOSIS — I10 Essential (primary) hypertension: Secondary | ICD-10-CM

## 2021-03-13 DIAGNOSIS — I110 Hypertensive heart disease with heart failure: Secondary | ICD-10-CM

## 2021-03-13 MED ORDER — SACUBITRIL-VALSARTAN 49-51 MG PO TABS: 1.0000 | ORAL_TABLET | Freq: Two times a day (BID) | ORAL | 1 refills | Status: DC

## 2021-03-13 MED ORDER — FUROSEMIDE 20 MG PO TABS: 20.0000 mg | ORAL_TABLET | Freq: Every day | ORAL | 1 refills | Status: DC

## 2021-03-13 MED ORDER — AMLODIPINE BESYLATE 10 MG PO TABS: 10.0000 mg | ORAL_TABLET | Freq: Every day | ORAL | 1 refills | Status: DC

## 2021-03-13 MED ORDER — TIZANIDINE HCL 4 MG PO TABS
4.0000 mg | ORAL_TABLET | Freq: Three times a day (TID) | ORAL | 0 refills | Status: DC | PRN
Start: 2021-03-13 — End: 2023-07-01

## 2021-03-13 MED ORDER — ISOSORBIDE MONONITRATE ER 60 MG PO TB24: 60.0000 mg | ORAL_TABLET | Freq: Every day | ORAL | 1 refills | Status: DC

## 2021-03-13 MED ORDER — CARVEDILOL 12.5 MG PO TABS: 12.5000 mg | ORAL_TABLET | Freq: Two times a day (BID) | ORAL | 3 refills | Status: DC

## 2021-03-13 MED ORDER — DAPAGLIFLOZIN PROPANEDIOL 5 MG PO TABS: 5.0000 mg | ORAL_TABLET | Freq: Every day | ORAL | 1 refills | Status: DC

## 2021-03-13 MED ORDER — SPIRONOLACTONE 25 MG PO TABS: ORAL_TABLET | ORAL | 1 refills | Status: DC

## 2021-03-13 NOTE — Patient Instructions (Signed)

## 2021-03-13 NOTE — Progress Notes (Signed)
Subjective:  Patient ID: Susan Hays, female    DOB: 1970/09/25  Age: 50 y.o. MRN: [id number]  CC: Hypertension   HPI Susan Hays is a 50 y.o. year old female with a history of HFrEF (EF 25 to 30% from 02/2020) hypertension and obesity  Interval History: Her BP is elevated and she is yet to take her antihypertensive. She has not been taking Hydralazine as it causes her to have a headache. She took some of her antihypertensives as he states she has to space them out but did not take all of them. Denies presence of dyspnea or chest pain.  Has not been to see her cardiologist in a while.  She has intermittent pain and numbness on her L upper extremitiy. She works at [employer] and does a lot of heavy lifting. She uses muscle rub for relied Past Medical History:  Diagnosis Date   Hypertension     Past Surgical History:  Procedure Laterality Date   CESAREAN SECTION      Family History  Problem Relation Age of Onset   Sudden Cardiac Death Neg Hx     No Known Allergies  Outpatient Medications Prior to Visit  Medication Sig Dispense Refill   acetaminophen (TYLENOL) 500 MG tablet Take 1,000 mg by mouth every 6 (six) hours as needed for moderate pain.     aspirin-acetaminophen-caffeine (EXCEDRIN EXTRA STRENGTH) 250-250-65 MG tablet Take 1-2 tablets by mouth every 6 (six) hours as needed for headache or migraine.     Iron, Ferrous Sulfate, 325 (65 Fe) MG TABS Take 325 mg by mouth daily. 30 tablet 4   amLODipine (NORVASC) 5 MG tablet Take 1 tablet (5 mg total) by mouth daily. 30 tablet 4   carvedilol (COREG) 12.5 MG tablet Take 1 tablet (12.5 mg total) by mouth 2 (two) times daily with a meal. 60 tablet 3   dapagliflozin propanediol (FARXIGA) 5 MG TABS tablet Take 1 tablet (5 mg total) by mouth daily before breakfast. 90 tablet 0   furosemide (LASIX) 20 MG tablet Take 1 tablet (20 mg total) by mouth daily. 90 tablet 0   hydrALAZINE (APRESOLINE) 50 MG tablet Take 1 tablet (50  mg total) by mouth 3 (three) times daily. 90 tablet 0   isosorbide mononitrate (IMDUR) 60 MG 24 hr tablet Take 1 tablet (60 mg total) by mouth at bedtime. 90 tablet 0   magnesium oxide (MAG-OX) 400 (241.3 Mg) MG tablet Take 1 tablet (400 mg total) by mouth 2 (two) times daily. 30 tablet 0   sacubitril-valsartan (ENTRESTO) 49-51 MG Take 1 tablet by mouth 2 (two) times daily. 60 tablet 3   spironolactone (ALDACTONE) 25 MG tablet TAKE 1 TABLET BY MOUTH EVERYDAY AT BEDTIME 90 tablet 0   No facility-administered medications prior to visit.     ROS Review of Systems  Constitutional:  Negative for activity change, appetite change and fatigue.  HENT:  Negative for congestion, sinus pressure and sore throat.   Eyes:  Negative for visual disturbance.  Respiratory:  Negative for cough, chest tightness, shortness of breath and wheezing.   Cardiovascular:  Negative for chest pain and palpitations.  Gastrointestinal:  Negative for abdominal distention, abdominal pain and constipation.  Endocrine: Negative for polydipsia.  Genitourinary:  Negative for dysuria and frequency.  Musculoskeletal:        See HPI  Skin:  Negative for rash.  Neurological:  Positive for numbness. Negative for tremors and light-headedness.  Hematological:  Does not bruise/bleed easily.  Psychiatric/Behavioral:  Negative for agitation and behavioral problems.    Objective:  BP (!) 182/87   Pulse 75   Ht 5\' 5"  (1.651 m)   Wt 218 lb 9.6 oz (99.2 kg)   SpO2 99%   BMI 36.38 kg/m   BP/Weight 03/13/2021 12/21/2020 03/30/2020  Systolic BP 182 181 190  Diastolic BP 87 92 83  Wt. (Lbs) 218.6 219.2 220  BMI 36.38 36.48 36.61      Physical Exam Constitutional:      Appearance: She is well-developed.  Cardiovascular:     Rate and Rhythm: Normal rate.     Heart sounds: Murmur heard.  Pulmonary:     Effort: Pulmonary effort is normal.     Breath sounds: Normal breath sounds. No wheezing or rales.  Chest:     Chest wall:  No tenderness.  Abdominal:     General: Bowel sounds are normal. There is no distension.     Palpations: Abdomen is soft. There is no mass.     Tenderness: There is no abdominal tenderness.  Musculoskeletal:        General: Normal range of motion.     Right lower leg: No edema.     Left lower leg: No edema.     Comments: Negative Neer sign, negative Hawkins sign in left upper extremity Right upper extremity is normal Normal handgrip  Neurological:     Mental Status: She is alert and oriented to person, place, and time.  Psychiatric:        Mood and Affect: Mood normal.   CMP Latest Ref Rng & Units 03/13/2021 12/21/2020 03/04/2020  Glucose 70 - 99 mg/dL 03/06/2020) 407(W) 808(U)  BUN 6 - 24 mg/dL 11 14 10   Creatinine 0.57 - 1.00 mg/dL 110(R 1.59)  Sodium 134 - 144 mmol/L 135 138 135  Potassium 3.5 - 5.2 mmol/L 3.9 4.2 3.9  Chloride 96 - 106 mmol/L 98 96 99  CO2 20 - 29 mmol/L 22 26 25   Calcium 8.7 - 10.2 mg/dL 9.4 9.2 8.0(L)  Total Protein 6.0 - 8.5 g/dL - 7.6 -  Total Bilirubin 0.0 - 1.2 mg/dL - 4.58 -  Alkaline Phos 44 - 121 IU/L - 111 -  AST 0 - 40 IU/L - 24 -  ALT 0 - 32 IU/L - 15 -    Lipid Panel  No results found for: CHOL, TRIG, HDL, CHOLHDL, VLDL, LDLCALC, LDLDIRECT  CBC    Component Value Date/Time   WBC 7.7 12/21/2020 1419   WBC 9.4 03/04/2020 1114   RBC 4.01 12/21/2020 1419   RBC 4.35 03/04/2020 1114   HGB 8.6 (L) 12/21/2020 1419   HCT 29.1 (L) 12/21/2020 1419   PLT 333 12/21/2020 1419   MCV 73 (L) 12/21/2020 1419   MCH 21.4 (L) 12/21/2020 1419   MCH 25.7 (L) 03/04/2020 1114   MCHC 29.6 (L) 12/21/2020 1419   MCHC 30.4 03/04/2020 1114   RDW 16.0 (H) 12/21/2020 1419   LYMPHSABS 1.6 12/21/2020 1419   MONOABS 0.7 07/25/2007 1645   EOSABS 0.1 12/21/2020 1419   BASOSABS 0.1 12/21/2020 1419    Lab Results  Component Value Date   HGBA1C 6.0 (H) 12/21/2020    Assessment & Plan:  1. Hypertensive heart disease with chronic systolic congestive heart failure  (HCC) Euvolemic with EF of 25 to 30% from echo of 02/2020 Unable to tolerate hydralazine Continue other guideline directed medical therapy Advised to schedule appointment with cardiology - isosorbide mononitrate (IMDUR) 60 MG  24 hr tablet; Take 1 tablet (60 mg total) by mouth daily.  Dispense: 90 tablet; Refill: 1 - spironolactone (ALDACTONE) 25 MG tablet; TAKE 1 TABLET BY MOUTH EVERYDAY  Dispense: 90 tablet; Refill: 1 - amLODipine (NORVASC) 10 MG tablet; Take 1 tablet (10 mg total) by mouth daily.  Dispense: 90 tablet; Refill: 1 - carvedilol (COREG) 12.5 MG tablet; Take 1 tablet (12.5 mg total) by mouth 2 (two) times daily with a meal.  Dispense: 60 tablet; Refill: 3 - dapagliflozin propanediol (FARXIGA) 5 MG TABS tablet; Take 1 tablet (5 mg total) by mouth daily before breakfast.  Dispense: 90 tablet; Refill: 1 - furosemide (LASIX) 20 MG tablet; Take 1 tablet (20 mg total) by mouth daily.  Dispense: 90 tablet; Refill: 1 - sacubitril-valsartan (ENTRESTO) 49-51 MG; Take 1 tablet by mouth 2 (two) times daily.  Dispense: 180 tablet; Refill: 1  2. Essential hypertension Uncontrolled due to noncompliance Compliance has been emphasized - Basic Metabolic Panel  3. Prediabetes Last A1c was 6.0 Continue lifestyle modifications to prevent progression to diabetes mellitus - dapagliflozin propanediol (FARXIGA) 5 MG TABS tablet; Take 1 tablet (5 mg total) by mouth daily before breakfast.  Dispense: 90 tablet; Refill: 1  4. Impingement syndrome of left shoulder Arising from her job Will place on muscle relaxant Consider PT if symptoms persist - tiZANidine (ZANAFLEX) 4 MG tablet; Take 1 tablet (4 mg total) by mouth every 8 (eight) hours as needed for muscle spasms.  Dispense: 30 tablet; Refill: 0    Meds ordered this encounter  Medications   isosorbide mononitrate (IMDUR) 60 MG 24 hr tablet    Sig: Take 1 tablet (60 mg total) by mouth daily.    Dispense:  90 tablet    Refill:  1    spironolactone (ALDACTONE) 25 MG tablet    Sig: TAKE 1 TABLET BY MOUTH EVERYDAY    Dispense:  90 tablet    Refill:  1   amLODipine (NORVASC) 10 MG tablet    Sig: Take 1 tablet (10 mg total) by mouth daily.    Dispense:  90 tablet    Refill:  1    Dose increase   carvedilol (COREG) 12.5 MG tablet    Sig: Take 1 tablet (12.5 mg total) by mouth 2 (two) times daily with a meal.    Dispense:  60 tablet    Refill:  3   dapagliflozin propanediol (FARXIGA) 5 MG TABS tablet    Sig: Take 1 tablet (5 mg total) by mouth daily before breakfast.    Dispense:  90 tablet    Refill:  1   furosemide (LASIX) 20 MG tablet    Sig: Take 1 tablet (20 mg total) by mouth daily.    Dispense:  90 tablet    Refill:  1   sacubitril-valsartan (ENTRESTO) 49-51 MG    Sig: Take 1 tablet by mouth 2 (two) times daily.    Dispense:  180 tablet    Refill:  1   tiZANidine (ZANAFLEX) 4 MG tablet    Sig: Take 1 tablet (4 mg total) by mouth every 8 (eight) hours as needed for muscle spasms.    Dispense:  30 tablet    Refill:  0    Follow-up: Return in about 1 month (around 04/13/2021) for Hypertension.       Hoy Register, MD, FAAFP. [facility] [place], [place] [phone]   03/14/2021, 1:12 PM

## 2021-03-14 LAB — BASIC METABOLIC PANEL
BUN/Creatinine Ratio: 14 (ref 9–23)
BUN: 11 mg/dL (ref 6–24)
CO2: 22 mmol/L (ref 20–29)
Calcium: 9.4 mg/dL (ref 8.7–10.2)
Chloride: 98 mmol/L (ref 96–106)
Creatinine, Ser: 0.79 mg/dL (ref 0.57–1.00)
Glucose: 110 mg/dL — ABNORMAL HIGH (ref 70–99)
Potassium: 3.9 mmol/L (ref 3.5–5.2)
Sodium: 135 mmol/L (ref 134–144)
eGFR: 91 mL/min/{1.73_m2} (ref >59)

## 2021-03-17 ENCOUNTER — Telehealth: Payer: Self-pay

## 2021-03-17 NOTE — Telephone Encounter (Signed)
-----   Message from Hoy Register, MD sent at 03/14/2021  1:15 PM EDT ----- Please inform her that her labs are normal.

## 2021-03-17 NOTE — Telephone Encounter (Signed)
Patient name and DOB has been verified Patient was informed of lab results. Patient had no questions.  

## 2021-05-30 ENCOUNTER — Telehealth: Payer: Self-pay

## 2021-05-30 NOTE — Telephone Encounter (Signed)
PA for Entresto approved until 05/30/22. Pharmacy notified.

## 2021-06-18 ENCOUNTER — Other Ambulatory Visit: Payer: Self-pay | Admitting: Physician Assistant

## 2021-06-18 ENCOUNTER — Other Ambulatory Visit: Payer: Self-pay | Admitting: Family Medicine

## 2021-06-18 DIAGNOSIS — I110 Hypertensive heart disease with heart failure: Secondary | ICD-10-CM

## 2021-06-18 DIAGNOSIS — I10 Essential (primary) hypertension: Secondary | ICD-10-CM

## 2021-06-18 NOTE — Telephone Encounter (Signed)
last RF 03/13/21 dose changed Dr Alvis Lemmings  Requested Prescriptions  Refused Prescriptions Disp Refills   amLODipine (NORVASC) 5 MG tablet [Pharmacy Med Name: AMLODIPINE BESYLATE 5 MG TAB] 90 tablet 1    Sig: TAKE 1 TABLET (5 MG TOTAL) BY MOUTH DAILY.     Cardiovascular:  Calcium Channel Blockers Failed - 06/18/2021  1:16 PM      Failed - Last BP in normal range    BP Readings from Last 1 Encounters:  03/13/21 (!) 182/87         Passed - Valid encounter within last 6 months    Recent Outpatient Visits          3 months ago Impingement syndrome of left shoulder   [facility] [place], Odette Horns, MD   5 months ago Essential hypertension   [facility] [place], [place], [place]   1 year ago Prediabetes   [facility] Hoy Register, MD

## 2021-06-18 NOTE — Telephone Encounter (Signed)
Requested Prescriptions  Pending Prescriptions Disp Refills   carvedilol (COREG) 12.5 MG tablet [Pharmacy Med Name: CARVEDILOL 12.5 MG TABLET] 180 tablet 0    Sig: TAKE 1 TABLET (12.5 MG TOTAL) BY MOUTH 2 (TWO) TIMES DAILY WITH A MEAL.     Cardiovascular:  Beta Blockers Failed - 06/18/2021  1:16 PM      Failed - Last BP in normal range    BP Readings from Last 1 Encounters:  03/13/21 (!) 182/87         Passed - Last Heart Rate in normal range    Pulse Readings from Last 1 Encounters:  03/13/21 75         Passed - Valid encounter within last 6 months    Recent Outpatient Visits          3 months ago Impingement syndrome of left shoulder   [place], Charlane Ferretti, MD   5 months ago Essential hypertension   [facility] [place], [place], [place]   1 year ago Prediabetes   [facility] [name], MD

## 2021-09-10 ENCOUNTER — Other Ambulatory Visit: Payer: Self-pay | Admitting: Family Medicine

## 2021-09-10 ENCOUNTER — Other Ambulatory Visit: Payer: Self-pay | Admitting: Physician Assistant

## 2021-09-10 DIAGNOSIS — I110 Hypertensive heart disease with heart failure: Secondary | ICD-10-CM

## 2021-09-10 DIAGNOSIS — I5022 Chronic systolic (congestive) heart failure: Secondary | ICD-10-CM

## 2021-09-10 DIAGNOSIS — R7303 Prediabetes: Secondary | ICD-10-CM

## 2021-09-12 NOTE — Telephone Encounter (Signed)
Requested medication (s) are due for refill today: yes ? ?Requested medication (s) are on the active medication list: yes ? ?Last refill:  amlodipine 03/13/21 #90/1, carvedilol 06/18/21 #180/0 ? ?Future visit scheduled: no ? ?Notes to clinic:  pt is overdue for an appt. Attempted to call pt, VM full.  ? ? ?  ?Requested Prescriptions  ?Pending Prescriptions Disp Refills  ? carvedilol (COREG) 12.5 MG tablet [Pharmacy Med Name: CARVEDILOL 12.5 MG TABLET] 180 tablet 0  ?  Sig: TAKE 1 TABLET (12.5MG  TOTAL) BY MOUTH TWICE A DAY WITH MEALS  ?  ? Cardiovascular: Beta Blockers 3 Failed - 09/10/2021 12:03 PM  ?  ?  Failed - Last BP in normal range  ?  BP Readings from Last 1 Encounters:  ?03/13/21 (!) 182/87  ?  ?  ?  ?  Failed - Valid encounter within last 6 months  ?  Recent Outpatient Visits   ? ?      ? 6 months ago Impingement syndrome of left shoulder  ? [facility] [place], Odette Horns, MD  ? 8 months ago Essential hypertension  ? [facility] [place], [place], [place]  ? 1 year ago Prediabetes  ? [facility] [place], Odette Horns, MD  ? ?  ?  ? ?  ?  ?  Passed - Cr in normal range and within 360 days  ?  Creatinine, Ser  ?Date Value Ref Range Status  ?03/13/2021 0.79 0.57 - 1.00 mg/dL Final  ?  ?  ?  ?  Passed - AST in normal range and within 360 days  ?  AST  ?Date Value Ref Range Status  ?12/21/2020 24 0 - 40 IU/L Final  ?  ?  ?  ?  Passed - ALT in normal range and within 360 days  ?  ALT  ?Date Value Ref Range Status  ?12/21/2020 15 0 - 32 IU/L Final  ?  ?  ?  ?  Passed - Last Heart Rate in normal range  ?  Pulse Readings from Last 1 Encounters:  ?03/13/21 75  ?  ?  ?  ?  ? amLODipine (NORVASC) 10 MG tablet [Pharmacy Med Name: AMLODIPINE BESYLATE 10 MG TAB] 90 tablet 1  ?  Sig: TAKE 1 TABLET BY MOUTH EVERY DAY  ?  ? Cardiovascular: Calcium Channel Blockers 2 Failed - 09/10/2021 12:03 PM  ?  ?  Failed - Last BP in normal range  ?  BP Readings from  Last 1 Encounters:  ?03/13/21 (!) 182/87  ?  ?  ?  ?  Failed - Valid encounter within last 6 months  ?  Recent Outpatient Visits   ? ?      ? 6 months ago Impingement syndrome of left shoulder  ? [facility] [place], Odette Horns, MD  ? 8 months ago Essential hypertension  ? [facility] [place], [place], [place]  ? 1 year ago Prediabetes  ? [facility] [place], Odette Horns, MD  ? ?  ?  ? ?  ?  ?  Passed - Last Heart Rate in normal range  ?  Pulse Readings from Last 1 Encounters:  ?03/13/21 75  ?  ?  ?  ?  ? ?

## 2021-09-12 NOTE — Telephone Encounter (Signed)
Requested medication (s) are due for refill today: yes ? ?Requested medication (s) are on the active medication list: yes ? ?Last refill:  06/10/21 ? ?Future visit scheduled: no ? ?Notes to clinic:  pt is overdue for an appt. Attempted to call pt, VM full.  ? ? ?  ?Requested Prescriptions  ?Pending Prescriptions Disp Refills  ? FARXIGA 5 MG TABS tablet [Pharmacy Med Name: FARXIGA 5 MG TABLET] 90 tablet 1  ?  Sig: TAKE 1 TABLET BY MOUTH DAILY BEFORE BREAKFAST.  ?  ? Endocrinology:  Diabetes - SGLT2 Inhibitors Failed - 09/10/2021 12:03 PM  ?  ?  Failed - HBA1C is between 0 and 7.9 and within 180 days  ?  HbA1c, POC (controlled diabetic range)  ?Date Value Ref Range Status  ?03/30/2020 6.2 0.0 - 7.0 % Final  ? ?Hgb A1c MFr Bld  ?Date Value Ref Range Status  ?12/21/2020 6.0 (H) 4.8 - 5.6 % Final  ?  Comment:  ?           Prediabetes: 5.7 - 6.4 ?         Diabetes: >6.4 ?         Glycemic control for adults with diabetes: <7.0 ?  ?  ?  ?  ?  Failed - Valid encounter within last 6 months  ?  Recent Outpatient Visits   ? ?      ? 6 months ago Impingement syndrome of left shoulder  ? [place], Charlane Ferretti, MD  ? 8 months ago Essential hypertension  ? [place], [place], [place]  ? 1 year ago Prediabetes  ? [place], Charlane Ferretti, MD  ? ?  ?  ? ?  ?  ?  Passed - Cr in normal range and within 360 days  ?  Creatinine, Ser  ?Date Value Ref Range Status  ?03/13/2021 0.79 0.57 - 1.00 mg/dL Final  ?  ?  ?  ?  Passed - eGFR in normal range and within 360 days  ?  GFR calc Af Amer  ?Date Value Ref Range Status  ?03/04/2020 >60 >60 mL/min Final  ? ?GFR calc non Af Amer  ?Date Value Ref Range Status  ?03/04/2020 >60 >60 mL/min Final  ? ?eGFR  ?Date Value Ref Range Status  ?03/13/2021 91 >59 mL/min/1.73 Final  ?  ?  ?  ?  ? ?

## 2021-09-12 NOTE — Telephone Encounter (Signed)
Pt called, unable to leave VM d/t VM full. Pt is due for an appt for rx refills.  ?

## 2021-09-18 ENCOUNTER — Telehealth: Payer: Self-pay | Admitting: Pharmacist

## 2021-09-18 NOTE — Patient Outreach (Signed)
Patient appearing on report for [employer] - Hypertension Control report due to last documented ambulatory blood pressure of 182/87 on 03/13/2021 Next appointment with PCP is not scheduled  ? ?Outreached patient to discuss hypertension control and medication management. Unable to leave voicemail as mailbox is full. Outreached her emergency contact, was unable to leave message either ? ? ?Catie Eppie Gibson, PharmD, BCACP ?[facility] ?[phone] ? ?

## 2021-09-28 NOTE — Telephone Encounter (Signed)
Attempted outreach again. Unable to leave message on patient's phone or on her emergency contact's phone ?

## 2021-09-28 NOTE — Telephone Encounter (Signed)
Received call back from patient via [place], but I was on a call and it was sent to voicemail. Patient identified herself in the voicemail and asked that I return her call. Attempted to call her back, voicemail was full.  ? ?Catie Hedwig Morton, PharmD, BCACP ?[place] ?[phone] ? ?

## 2021-10-04 NOTE — Patient Outreach (Signed)
Attempted to call patient again for outreach. Unable to leave voicemail as voicemail was full ?

## 2021-10-05 ENCOUNTER — Other Ambulatory Visit: Payer: Self-pay | Admitting: Family Medicine

## 2021-10-05 DIAGNOSIS — I110 Hypertensive heart disease with heart failure: Secondary | ICD-10-CM

## 2021-11-20 ENCOUNTER — Telehealth: Payer: Self-pay | Admitting: Pharmacist

## 2021-11-20 NOTE — Telephone Encounter (Signed)
Patient appearing on report for [employer] - Hypertension Control report due to last documented ambulatory blood pressure of 182/87 on 03/13/2021 Next appointment with PCP is not scheduled   Attempted to outreach patient. Unable to leave voicemail on her line or her emergency contact's line.   Catie Eppie Gibson, PharmD, [facility] [phone],s

## 2021-12-18 ENCOUNTER — Emergency Department (HOSPITAL_COMMUNITY)
Admission: EM | Admit: 2021-12-18 | Discharge: 2021-12-18 | Disposition: A | Discharge status: Home / Self Care | Payer: Medicaid Other | Attending: Emergency Medicine | Admitting: Emergency Medicine

## 2021-12-18 ENCOUNTER — Other Ambulatory Visit: Payer: Self-pay

## 2021-12-18 ENCOUNTER — Emergency Department (HOSPITAL_COMMUNITY): Payer: Medicaid Other

## 2021-12-18 ENCOUNTER — Encounter (HOSPITAL_COMMUNITY): Payer: Self-pay

## 2021-12-18 DIAGNOSIS — I110 Hypertensive heart disease with heart failure: Secondary | ICD-10-CM | POA: Insufficient documentation

## 2021-12-18 DIAGNOSIS — R0602 Shortness of breath: Secondary | ICD-10-CM | POA: Diagnosis present

## 2021-12-18 DIAGNOSIS — I509 Heart failure, unspecified: Secondary | ICD-10-CM | POA: Diagnosis not present

## 2021-12-18 DIAGNOSIS — Z79899 Other long term (current) drug therapy: Secondary | ICD-10-CM | POA: Diagnosis not present

## 2021-12-18 DIAGNOSIS — Z7901 Long term (current) use of anticoagulants: Secondary | ICD-10-CM | POA: Diagnosis not present

## 2021-12-18 DIAGNOSIS — I480 Paroxysmal atrial fibrillation: Secondary | ICD-10-CM

## 2021-12-18 DIAGNOSIS — N9489 Other specified conditions associated with female genital organs and menstrual cycle: Secondary | ICD-10-CM | POA: Diagnosis not present

## 2021-12-18 LAB — TSH: TSH: 4.64 u[IU]/mL — ABNORMAL HIGH (ref 0.350–4.500)

## 2021-12-18 LAB — BASIC METABOLIC PANEL
Anion gap: 10 (ref 5–15)
BUN: 13 mg/dL (ref 6–20)
CO2: 24 mmol/L (ref 22–32)
Calcium: 9.1 mg/dL (ref 8.9–10.3)
Chloride: 103 mmol/L (ref 98–111)
Creatinine, Ser: 1.25 mg/dL — ABNORMAL HIGH (ref 0.44–1.00)
GFR, Estimated: 52 mL/min — ABNORMAL LOW (ref >60)
Glucose, Bld: 167 mg/dL — ABNORMAL HIGH (ref 70–99)
Potassium: 4 mmol/L (ref 3.5–5.1)
Sodium: 137 mmol/L (ref 135–145)

## 2021-12-18 LAB — CBC
HCT: 32.9 % — ABNORMAL LOW (ref 36.0–46.0)
Hemoglobin: 9.2 g/dL — ABNORMAL LOW (ref 12.0–15.0)
MCH: 19.5 pg — ABNORMAL LOW (ref 26.0–34.0)
MCHC: 28 g/dL — ABNORMAL LOW (ref 30.0–36.0)
MCV: 69.9 fL — ABNORMAL LOW (ref 80.0–100.0)
Platelets: 427 10*3/uL — ABNORMAL HIGH (ref 150–400)
RBC: 4.71 MIL/uL (ref 3.87–5.11)
RDW: 17.7 % — ABNORMAL HIGH (ref 11.5–15.5)
WBC: 12.3 10*3/uL — ABNORMAL HIGH (ref 4.0–10.5)
nRBC: 0 % (ref 0.0–0.2)

## 2021-12-18 LAB — I-STAT BETA HCG BLOOD, ED (MC, WL, AP ONLY): I-stat hCG, quantitative: <5 m[IU]/mL (ref <5)

## 2021-12-18 LAB — MAGNESIUM: Magnesium: 1.9 mg/dL (ref 1.7–2.4)

## 2021-12-18 LAB — BRAIN NATRIURETIC PEPTIDE: B Natriuretic Peptide: 443.8 pg/mL — ABNORMAL HIGH (ref 0.0–100.0)

## 2021-12-18 LAB — T4, FREE: Free T4: 0.69 ng/dL (ref 0.61–1.12)

## 2021-12-18 LAB — TROPONIN I (HIGH SENSITIVITY)
Troponin I (High Sensitivity): 29 ng/L — ABNORMAL HIGH (ref <18)
Troponin I (High Sensitivity): 38 ng/L — ABNORMAL HIGH (ref <18)

## 2021-12-18 MED ORDER — APIXABAN 5 MG PO TABS: 5.0000 mg | ORAL_TABLET | Freq: Two times a day (BID) | ORAL | 0 refills | Status: DC

## 2021-12-18 MED ORDER — DILTIAZEM LOAD VIA INFUSION
20.0000 mg | Freq: Once | INTRAVENOUS | Status: AC
  Administered 2021-12-18: 20 mg via INTRAVENOUS
  Filled 2021-12-18: qty 20

## 2021-12-18 MED ORDER — DILTIAZEM HCL-DEXTROSE 125-5 MG/125ML-% IV SOLN (PREMIX)
5.0000 mg/h | INTRAVENOUS | Status: DC
  Administered 2021-12-18: 5 mg/h via INTRAVENOUS
  Filled 2021-12-18: qty 125

## 2021-12-18 MED ORDER — METOPROLOL SUCCINATE ER 25 MG PO TB24: 25.0000 mg | ORAL_TABLET | Freq: Every day | ORAL | 0 refills | Status: DC

## 2021-12-18 MED ORDER — DILTIAZEM HCL 30 MG PO TABS
30.0000 mg | ORAL_TABLET | Freq: Once | ORAL | Status: AC
  Administered 2021-12-18: 30 mg via ORAL
  Filled 2021-12-18: qty 1

## 2021-12-18 MED ORDER — APIXABAN 5 MG PO TABS
5.0000 mg | ORAL_TABLET | Freq: Two times a day (BID) | ORAL | Status: DC
  Administered 2021-12-18: 5 mg via ORAL
  Filled 2021-12-18: qty 1

## 2021-12-18 NOTE — ED Provider Notes (Signed)
[facility] EMERGENCY DEPARTMENT Provider Note   CSN: [id number] Arrival date & time: 12/18/21  1636     History {Add pertinent medical, surgical, social history, OB history to HPI:1} Chief Complaint  Patient presents with  . Chest Pain    LAYSA KIMMEY is a 51 y.o. female.   Chest Pain  Patient is a 51 year old female with a history of      Home Medications Prior to Admission medications   Medication Sig Start Date End Date Taking? Authorizing Provider  acetaminophen (TYLENOL) 500 MG tablet Take 1,000 mg by mouth every 6 (six) hours as needed for moderate pain.    [provider]  amLODipine (NORVASC) 10 MG tablet TAKE 1 TABLET BY MOUTH EVERY DAY 09/12/21   Hoy Register, MD  aspirin-acetaminophen-caffeine (EXCEDRIN EXTRA STRENGTH) [phone] MG tablet Take 1-2 tablets by mouth every 6 (six) hours as needed for headache or migraine.    [provider]  carvedilol (COREG) 12.5 MG tablet TAKE 1 TABLET (12.5MG  TOTAL) BY MOUTH TWICE A DAY WITH MEALS 09/12/21   Hoy Register, MD  dapagliflozin propanediol (FARXIGA) 5 MG TABS tablet TAKE 1 TABLET BY MOUTH DAILY BEFORE BREAKFAST. 09/12/21   Hoy Register, MD  furosemide (LASIX) 20 MG tablet Take 1 tablet (20 mg total) by mouth daily. 03/13/21   Hoy Register, MD  Iron, Ferrous Sulfate, 325 (65 Fe) MG TABS Take 325 mg by mouth daily. 12/22/20   Anders Simmonds, PA-C  isosorbide mononitrate (IMDUR) 60 MG 24 hr tablet Take 1 tablet (60 mg total) by mouth daily. 03/13/21   Hoy Register, MD  sacubitril-valsartan (ENTRESTO) 49-51 MG Take 1 tablet by mouth 2 (two) times daily. 03/13/21   Hoy Register, MD  spironolactone (ALDACTONE) 25 MG tablet TAKE 1 TABLET BY MOUTH EVERYDAY 03/13/21   Hoy Register, MD  tiZANidine (ZANAFLEX) 4 MG tablet Take 1 tablet (4 mg total) by mouth every 8 (eight) hours as needed for muscle spasms. 03/13/21   Hoy Register, MD      Allergies    Patient has no known  allergies.    Review of Systems   Review of Systems  Cardiovascular:  Positive for chest pain.    Physical Exam Updated Vital Signs BP (!) 163/87 (BP Location: Right Arm)   Pulse (!) 156   Temp 99.2 F (37.3 C) (Oral)   Resp 18   Ht 5\' 5"  (1.651 m)   Wt 98.9 kg   LMP 11/21/2021 (Approximate)   SpO2 100%   BMI 36.28 kg/m  Physical Exam  ED Results / Procedures / Treatments   Labs (all labs ordered are listed, but only abnormal results are displayed) Labs Reviewed - No data to display  EKG None  Radiology No results found.  Procedures Procedures  {Document cardiac monitor, telemetry assessment procedure when appropriate:1}  Medications Ordered in ED Medications - No data to display  ED Course/ Medical Decision Making/ A&P                           Medical Decision Making Amount and/or Complexity of Data Reviewed Labs: ordered. Radiology: ordered.  Risk Prescription drug management. Decision regarding hospitalization.   ***  {Document critical care time when appropriate:1} {Document review of labs and clinical decision tools ie heart score, Chads2Vasc2 etc:1}  {Document your independent review of radiology images, and any outside records:1} {Document your discussion with family members, caretakers, and with consultants:1} {Document social determinants  of health affecting pt's care:1} {Document your decision making why or why not admission, treatments were needed:1} Final Clinical Impression(s) / ED Diagnoses Final diagnoses:  None    Rx / DC Orders ED Discharge Orders     None

## 2021-12-18 NOTE — ED Provider Notes (Incomplete)
[facility] EMERGENCY DEPARTMENT Provider Note   CSN: [id number] Arrival date & time: 12/18/21  1636     History {Add pertinent medical, surgical, social history, OB history to HPI:1} Chief Complaint  Patient presents with  . Chest Pain    Susan Hays is a 51 y.o. female.   Chest Pain  Patient is a 51 year old female with a history of      Home Medications Prior to Admission medications   Medication Sig Start Date End Date Taking? Authorizing Provider  acetaminophen (TYLENOL) 500 MG tablet Take 1,000 mg by mouth every 6 (six) hours as needed for moderate pain.    [provider]  amLODipine (NORVASC) 10 MG tablet TAKE 1 TABLET BY MOUTH EVERY DAY 09/12/21   Hoy Register, MD  aspirin-acetaminophen-caffeine (EXCEDRIN EXTRA STRENGTH) [phone] MG tablet Take 1-2 tablets by mouth every 6 (six) hours as needed for headache or migraine.    [provider]  carvedilol (COREG) 12.5 MG tablet TAKE 1 TABLET (12.5MG  TOTAL) BY MOUTH TWICE A DAY WITH MEALS 09/12/21   Hoy Register, MD  dapagliflozin propanediol (FARXIGA) 5 MG TABS tablet TAKE 1 TABLET BY MOUTH DAILY BEFORE BREAKFAST. 09/12/21   Hoy Register, MD  furosemide (LASIX) 20 MG tablet Take 1 tablet (20 mg total) by mouth daily. 03/13/21   Hoy Register, MD  Iron, Ferrous Sulfate, 325 (65 Fe) MG TABS Take 325 mg by mouth daily. 12/22/20   Anders Simmonds, PA-C  isosorbide mononitrate (IMDUR) 60 MG 24 hr tablet Take 1 tablet (60 mg total) by mouth daily. 03/13/21   Hoy Register, MD  sacubitril-valsartan (ENTRESTO) 49-51 MG Take 1 tablet by mouth 2 (two) times daily. 03/13/21   Hoy Register, MD  spironolactone (ALDACTONE) 25 MG tablet TAKE 1 TABLET BY MOUTH EVERYDAY 03/13/21   Hoy Register, MD  tiZANidine (ZANAFLEX) 4 MG tablet Take 1 tablet (4 mg total) by mouth every 8 (eight) hours as needed for muscle spasms. 03/13/21   Hoy Register, MD      Allergies    Patient has no known  allergies.    Review of Systems   Review of Systems  Cardiovascular:  Positive for chest pain.    Physical Exam Updated Vital Signs BP (!) 163/87 (BP Location: Right Arm)   Pulse (!) 156   Temp 99.2 F (37.3 C) (Oral)   Resp 18   Ht 5\' 5"  (1.651 m)   Wt 98.9 kg   LMP 11/21/2021 (Approximate)   SpO2 100%   BMI 36.28 kg/m  Physical Exam Vitals and nursing note reviewed.  Constitutional:      General: She is not in acute distress.    Appearance: She is well-developed.  HENT:     Right Ear: External ear normal.     Left Ear: External ear normal.     Nose: Nose normal.     Mouth/Throat:     Mouth: Mucous membranes are moist.     Pharynx: Oropharynx is clear.  Eyes:     Extraocular Movements: Extraocular movements intact.     Conjunctiva/sclera: Conjunctivae normal.     Pupils: Pupils are equal, round, and reactive to light.  Cardiovascular:     Rate and Rhythm: Tachycardia present. Rhythm irregular.     Heart sounds: No murmur heard. Pulmonary:     Effort: Pulmonary effort is normal. No respiratory distress.     Breath sounds: Normal breath sounds. No wheezing or rales.  Abdominal:     Palpations:  Abdomen is soft.     Tenderness: There is no abdominal tenderness. There is no guarding or rebound.  Musculoskeletal:        General: No swelling.     Cervical back: Neck supple. No rigidity.     Right lower leg: No edema.     Left lower leg: No edema.  Skin:    General: Skin is warm and dry.     Capillary Refill: Capillary refill takes less than 2 seconds.     Findings: No rash.  Neurological:     Mental Status: She is alert.  Psychiatric:        Mood and Affect: Mood normal.     ED Results / Procedures / Treatments   Labs (all labs ordered are listed, but only abnormal results are displayed) Labs Reviewed - No data to display  EKG None  Radiology No results found.  Procedures Procedures  {Document cardiac monitor, telemetry assessment procedure when  appropriate:1}  Medications Ordered in ED Medications - No data to display  ED Course/ Medical Decision Making/ A&P                           Medical Decision Making Problems Addressed: Paroxysmal atrial fibrillation [facility]): complicated acute illness or injury with systemic symptoms that poses a threat to life or bodily functions  Amount and/or Complexity of Data Reviewed External Data Reviewed: labs, ECG and notes. Labs: ordered. Radiology: ordered. ECG/medicine tests: ordered and independent interpretation performed.  Risk Prescription drug management. Decision regarding hospitalization.   ***  {Document critical care time when appropriate:1} {Document review of labs and clinical decision tools ie heart score, Chads2Vasc2 etc:1}  {Document your independent review of radiology images, and any outside records:1} {Document your discussion with family members, caretakers, and with consultants:1} {Document social determinants of health affecting pt's care:1} {Document your decision making why or why not admission, treatments were needed:1} Final Clinical Impression(s) / ED Diagnoses Final diagnoses:  None    Rx / DC Orders ED Discharge Orders     None

## 2021-12-18 NOTE — ED Triage Notes (Signed)
Pt reports sudden onset of central chest pain/tightness that started last night associated with SOB. She took her BP today at work and states it was elevated 141/90. EKG done at triage and HR is 156 a flutter.

## 2021-12-18 NOTE — ED Notes (Signed)
Pt able to ambulate to the bathroom with no assistance.

## 2021-12-18 NOTE — ED Notes (Signed)
EDP notified that the PT's HR stopping IV Diltiazem

## 2021-12-18 NOTE — Discharge Instructions (Addendum)
You were seen in the emergency department for evaluation of high heart rate and you were found to be in a rhythm called atrial fibrillation.  Your heart rate was lowered with medication here in the emergency department.  You will be discharged with 2 new medications called metoprolol and Eliquis.  Please take these medications as prescribed.  You are given a referral to cardiology to follow-up in the outpatient setting.  We have the recommended to attempt this appointment in the setting of your new cardiac based medications.  We also recommend following up with your primary care provider in the coming days for recheck of your symptoms.  Please return to the ED should you develop any similar ongoing elevated heart rate or shortness of breath.

## 2021-12-18 NOTE — Progress Notes (Addendum)
ANTICOAGULATION CONSULT NOTE - Initial Consult  Pharmacy Consult for apixaban Indication: atrial fibrillation  No Known Allergies  Patient Measurements: Height: 5\' 5"  (165.1 cm) Weight: 98.9 kg (218 lb) IBW/kg (Calculated) : 57  Vital Signs: Temp: 99.2 F (37.3 C) (07/10 1648) Temp Source: Oral (07/10 1648) BP: 117/60 (07/10 2145) Pulse Rate: 76 (07/10 2145)  Labs: Recent Labs    12/18/21 1705 12/18/21 1919  HGB 9.2*  --   HCT 32.9*  --   PLT 427*  --   CREATININE 1.25*  --   TROPONINIHS 29* 38*    Estimated Creatinine Clearance: 61.3 mL/min (A) (by C-G formula based on SCr of 1.25 mg/dL (H)).   Medical History: Past Medical History:  Diagnosis Date   Hypertension     Medications:  See home med rec  Assessment: 51 y.o. F presents with CP - found to have aflutter. To begin Eliquis. Hgb 9.2, plt 427.  Goal of Therapy:  Prevention of CVA Monitor platelets by anticoagulation protocol: Yes   Plan:  Apixaban 5mg  po BID Will f/u CBC and s/s bleeding  44, PharmD, BCPS Please see amion for complete clinical pharmacist phone list 12/18/2021,10:23 PM  Addendum (2310) Pt to be d/c home tonight - educated.  Christoper Fabian, PharmD, BCPS Please see amion for complete clinical pharmacist phone list 12/18/2021 11:08 PM

## 2021-12-19 ENCOUNTER — Telehealth (HOSPITAL_COMMUNITY): Payer: Self-pay

## 2021-12-19 NOTE — Telephone Encounter (Signed)
Reached out to patient to schedule ED f/u appointment. No answer or voicemail.

## 2022-05-04 ENCOUNTER — Telehealth: Payer: Self-pay

## 2022-05-04 ENCOUNTER — Other Ambulatory Visit: Payer: Self-pay

## 2022-05-04 NOTE — Telephone Encounter (Signed)
APPROVED UNTIL 05/04/23

## 2022-05-04 NOTE — Telephone Encounter (Signed)
PRIOR AUTHORIZATION FOR ENTRESTO HAS BEEN SUBMITTED TODAY VIA [employer] KEY: [id number]

## 2022-06-25 ENCOUNTER — Other Ambulatory Visit: Payer: Self-pay | Admitting: Family Medicine

## 2022-06-25 DIAGNOSIS — I110 Hypertensive heart disease with heart failure: Secondary | ICD-10-CM

## 2022-06-26 NOTE — Telephone Encounter (Signed)
Requested medication (s) are due for refill today: routing for review  Requested medication (s) are on the active medication list: yes  Last refill:  multiple dates  Future visit scheduled: no  Notes to clinic:  Unable to refill per protocol, courtesy refill already given, routing for provider approval.      Requested Prescriptions  Pending Prescriptions Disp Refills   carvedilol (COREG) 12.5 MG tablet [Pharmacy Med Name: CARVEDILOL 12.5 MG TABLET] 60 tablet 0    Sig: TAKE 1 TABLET (12.5MG  TOTAL) BY MOUTH TWICE A DAY WITH MEALS     Cardiovascular: Beta Blockers 3 Failed - 06/25/2022  4:29 PM      Failed - Cr in normal range and within 360 days    Creatinine, Ser  Date Value Ref Range Status  12/18/2021 1.25 (H) 0.44 - 1.00 mg/dL Final         Failed - AST in normal range and within 360 days    AST  Date Value Ref Range Status  12/21/2020 24 0 - 40 IU/L Final         Failed - ALT in normal range and within 360 days    ALT  Date Value Ref Range Status  12/21/2020 15 0 - 32 IU/L Final         Failed - Valid encounter within last 6 months    Recent Outpatient Visits           1 year ago Impingement syndrome of left shoulder   [place], Charlane Ferretti, MD   1 year ago Essential hypertension   [place], [place], [place]   2 years ago Prediabetes   [place], Brielle, MD              Passed - Last BP in normal range    BP Readings from Last 1 Encounters:  12/18/21 104/61         Passed - Last Heart Rate in normal range    Pulse Readings from Last 1 Encounters:  12/18/21 (!) 103          spironolactone (ALDACTONE) 25 MG tablet [Pharmacy Med Name: SPIRONOLACTONE 25 MG TABLET] 90 tablet 1    Sig: TAKE 1 TABLET BY MOUTH EVERY DAY     Cardiovascular: Diuretics - Aldosterone Antagonist Failed - 06/25/2022  4:29 PM      Failed - Cr in normal range and  within 180 days    Creatinine, Ser  Date Value Ref Range Status  12/18/2021 1.25 (H) 0.44 - 1.00 mg/dL Final         Failed - K in normal range and within 180 days    Potassium  Date Value Ref Range Status  12/18/2021 4.0 3.5 - 5.1 mmol/L Final         Failed - Na in normal range and within 180 days    Sodium  Date Value Ref Range Status  12/18/2021 137 135 - 145 mmol/L Final  03/13/2021 135 134 - 144 mmol/L Final         Failed - eGFR is 30 or above and within 180 days    GFR calc Af Amer  Date Value Ref Range Status  03/04/2020 >60 >60 mL/min Final   GFR, Estimated  Date Value Ref Range Status  12/18/2021 52 (L) >60 mL/min Final    Comment:    (NOTE) Calculated using the CKD-EPI Creatinine Equation (  2021)    eGFR  Date Value Ref Range Status  03/13/2021 91 >59 mL/min/1.73 Final         Failed - Valid encounter within last 6 months    Recent Outpatient Visits           1 year ago Impingement syndrome of left shoulder   [place], [place]ong, MD   1 year ago Essential hypertension   [place], [place], [place]   2 years ago Prediabetes   [place], Cesar Chavez, MD              Passed - Last BP in normal range    BP Readings from Last 1 Encounters:  12/18/21 104/61          ENTRESTO 49-51 MG [Pharmacy Med Name: ENTRESTO 49 MG-51 MG TABLET] 180 tablet 1    Sig: TAKE 1 TABLET BY MOUTH TWICE A DAY     Off-Protocol Failed - 06/25/2022  4:29 PM      Failed - Medication not assigned to a protocol, review manually.      Failed - Valid encounter within last 12 months    Recent Outpatient Visits           1 year ago Impingement syndrome of left shoulder   [place], Charlane Ferretti, MD   1 year ago Essential hypertension   [place], [place], [place]   2 years ago Prediabetes    [place], Bowdle, MD               amLODipine (NORVASC) 10 MG tablet [Pharmacy Med Name: AMLODIPINE BESYLATE 10 MG TAB] 30 tablet 0    Sig: TAKE 1 TABLET BY MOUTH EVERY DAY     Cardiovascular: Calcium Channel Blockers 2 Failed - 06/25/2022  4:29 PM      Failed - Valid encounter within last 6 months    Recent Outpatient Visits           1 year ago Impingement syndrome of left shoulder   [place], Enobong, MD   1 year ago Essential hypertension   [place], [place], [place]   2 years ago Prediabetes   [employer], [place], MD              Passed - Last BP in normal range    BP Readings from Last 1 Encounters:  12/18/21 104/61         Passed - Last Heart Rate in normal range    Pulse Readings from Last 1 Encounters:  12/18/21 (!) 103

## 2022-07-02 ENCOUNTER — Other Ambulatory Visit: Payer: Self-pay

## 2022-07-02 ENCOUNTER — Emergency Department (HOSPITAL_COMMUNITY)
Admission: EM | Admit: 2022-07-02 | Discharge: 2022-07-02 | Disposition: A | Discharge status: Home / Self Care | Payer: Medicaid Other | Attending: Emergency Medicine | Admitting: Emergency Medicine

## 2022-07-02 ENCOUNTER — Encounter (HOSPITAL_COMMUNITY): Payer: Self-pay

## 2022-07-02 ENCOUNTER — Emergency Department (HOSPITAL_COMMUNITY): Payer: Medicaid Other

## 2022-07-02 DIAGNOSIS — D649 Anemia, unspecified: Secondary | ICD-10-CM | POA: Diagnosis not present

## 2022-07-02 DIAGNOSIS — I159 Secondary hypertension, unspecified: Secondary | ICD-10-CM | POA: Diagnosis not present

## 2022-07-02 DIAGNOSIS — I4891 Unspecified atrial fibrillation: Secondary | ICD-10-CM | POA: Insufficient documentation

## 2022-07-02 DIAGNOSIS — I10 Essential (primary) hypertension: Secondary | ICD-10-CM | POA: Diagnosis not present

## 2022-07-02 DIAGNOSIS — R519 Headache, unspecified: Secondary | ICD-10-CM | POA: Diagnosis present

## 2022-07-02 DIAGNOSIS — Z79899 Other long term (current) drug therapy: Secondary | ICD-10-CM | POA: Diagnosis not present

## 2022-07-02 DIAGNOSIS — Z7901 Long term (current) use of anticoagulants: Secondary | ICD-10-CM | POA: Insufficient documentation

## 2022-07-02 DIAGNOSIS — J329 Chronic sinusitis, unspecified: Secondary | ICD-10-CM | POA: Diagnosis not present

## 2022-07-02 LAB — CBC WITH DIFFERENTIAL/PLATELET
Abs Immature Granulocytes: 0 10*3/uL (ref 0.00–0.07)
Basophils Absolute: 0 10*3/uL (ref 0.0–0.1)
Basophils Relative: 0 %
Eosinophils Absolute: 0 10*3/uL (ref 0.0–0.5)
Eosinophils Relative: 0 %
HCT: 28.6 % — ABNORMAL LOW (ref 36.0–46.0)
Hemoglobin: 8.1 g/dL — ABNORMAL LOW (ref 12.0–15.0)
Lymphocytes Relative: 6 %
Lymphs Abs: 0.4 10*3/uL — ABNORMAL LOW (ref 0.7–4.0)
MCH: 19.5 pg — ABNORMAL LOW (ref 26.0–34.0)
MCHC: 28.3 g/dL — ABNORMAL LOW (ref 30.0–36.0)
MCV: 68.8 fL — ABNORMAL LOW (ref 80.0–100.0)
Monocytes Absolute: 0.4 10*3/uL (ref 0.1–1.0)
Monocytes Relative: 7 %
Neutro Abs: 5.1 10*3/uL (ref 1.7–7.7)
Neutrophils Relative %: 87 %
Platelets: 270 10*3/uL (ref 150–400)
RBC: 4.16 MIL/uL (ref 3.87–5.11)
RDW: 18.4 % — ABNORMAL HIGH (ref 11.5–15.5)
WBC: 5.9 10*3/uL (ref 4.0–10.5)
nRBC: 0 % (ref 0.0–0.2)
nRBC: 0 /100 WBC

## 2022-07-02 LAB — COMPREHENSIVE METABOLIC PANEL
ALT: 16 U/L (ref 0–44)
AST: 26 U/L (ref 15–41)
Albumin: 3.6 g/dL (ref 3.5–5.0)
Alkaline Phosphatase: 60 U/L (ref 38–126)
Anion gap: 10 (ref 5–15)
BUN: 9 mg/dL (ref 6–20)
CO2: 25 mmol/L (ref 22–32)
Calcium: 8.2 mg/dL — ABNORMAL LOW (ref 8.9–10.3)
Chloride: 101 mmol/L (ref 98–111)
Creatinine, Ser: 0.9 mg/dL (ref 0.44–1.00)
GFR, Estimated: >60 mL/min (ref >60)
Glucose, Bld: 143 mg/dL — ABNORMAL HIGH (ref 70–99)
Potassium: 3.4 mmol/L — ABNORMAL LOW (ref 3.5–5.1)
Sodium: 136 mmol/L (ref 135–145)
Total Bilirubin: 0.2 mg/dL — ABNORMAL LOW (ref 0.3–1.2)
Total Protein: 7.8 g/dL (ref 6.5–8.1)

## 2022-07-02 MED ORDER — AMOXICILLIN-POT CLAVULANATE 875-125 MG PO TABS: 1.0000 | ORAL_TABLET | Freq: Two times a day (BID) | ORAL | 0 refills | Status: AC

## 2022-07-02 MED ORDER — SACUBITRIL-VALSARTAN 49-51 MG PO TABS
1.0000 | ORAL_TABLET | ORAL | Status: AC
  Administered 2022-07-02: 1 via ORAL
  Filled 2022-07-02: qty 1

## 2022-07-02 MED ORDER — CARVEDILOL 12.5 MG PO TABS
12.5000 mg | ORAL_TABLET | ORAL | Status: AC
  Administered 2022-07-02: 12.5 mg via ORAL
  Filled 2022-07-02: qty 1

## 2022-07-02 NOTE — ED Provider Triage Note (Signed)
Emergency Medicine Provider Triage Evaluation Note  Susan Hays , a 52 y.o. female  was evaluated in triage.  Pt complains of elevated BP and headache for the last day. Denies chest pain, SOB. Endorses headache 7/10. Is supposed to be on multiple meds but has only been taking one of her BP meds because she's waiting for the other ones to be filled on Friday. Unsure what her meds are.  Review of Systems  Positive: headache Negative: SOB, chest pain  Physical Exam  There were no vitals taken for this visit. Gen:   Awake, no distress   Resp:  Normal effort  MSK:   Moves extremities without difficulty  Other:  Neuro intact  Medical Decision Making  Medically screening exam initiated at 10:10 AM.  Appropriate orders placed.  Susan Hays was informed that the remainder of the evaluation will be completed by another provider, this initial triage assessment does not replace that evaluation, and the importance of remaining in the ED until their evaluation is complete.    Osvaldo Shipper, [place] 07/02/22 1014

## 2022-07-02 NOTE — Discharge Instructions (Signed)
You were seen for headache and elevated blood pressure in the emergency department. Your CT scan was reassuring. Your blood pressure improved with the medications you were given.   At home, please take the antibiotics we have given you to treat your sinus infection. You may also drink warm drinks or cold and warm showers to clear sinuses.    Check your MyChart online for the results of any tests that had not resulted by the time you left the emergency department.   Follow-up with your primary doctor in 2-3 days regarding your visit.    Return immediately to the emergency department if you experience any of the following: Worsening pain, chest pain, shortness of breath, or any other concerning symptoms.    Thank you for visiting our Emergency Department. It was a pleasure taking care of you today.

## 2022-07-02 NOTE — ED Triage Notes (Signed)
Reports HTN that started last night and has continued this morning.  Complains of headache.  Denies vision changes, sob cp dizziness or difficulty walking.

## 2022-07-02 NOTE — ED Provider Notes (Signed)
[facility] Provider Note   CSN: [id number] Arrival date & time: 07/02/22  [id number]     History  Chief Complaint  Patient presents with   Hypertension    Susan Hays is a 52 y.o. female.  52 year old female with a history of hypertension and atrial fibrillation on Eliquis who presents to the elevated blood pressure and headache.  Patient reports that she has been out of her home blood pressure medication for a prolonged period of time.  Says that she now only is taking her spironolactone but has not had her carvedilol, amlodipine, or Entresto recently.  Reports that her primary doctor did send these prescriptions to her pharmacy but she has not picked them up.  Says that she has been getting over a cold and took Robitussin and noted that her blood pressure had been elevated since then.  Last night was [id number] systolic and then today was [id number] systolic so she decided to come into the emergency department for evaluation.  Did have a headache in her occiput that she reports was present prior to arrival but has improved.  Was gradual in onset.  Denies any vomiting, vision changes, speech changes, numbness or weakness of her arms or legs.  Denies any chest pain or shortness of breath.       Home Medications Prior to Admission medications   Medication Sig Start Date End Date Taking? Authorizing Provider  amoxicillin-clavulanate (AUGMENTIN) 875-125 MG tablet Take 1 tablet by mouth every 12 (twelve) hours for 5 days. 07/02/22 07/07/22 Yes Fransico Meadow, MD  acetaminophen (TYLENOL) 500 MG tablet Take 1,000 mg by mouth every 6 (six) hours as needed for moderate pain.    [provider]  amLODipine (NORVASC) 10 MG tablet TAKE 1 TABLET BY MOUTH EVERY DAY Patient not taking: Reported on 12/18/2021 09/12/21   [name], MD  apixaban (ELIQUIS) 5 MG TABS tablet Take 1 tablet (5 mg total) by mouth 2 (two) times daily. 12/18/21 01/17/22  Nelta Numbers, MD  dapagliflozin propanediol (FARXIGA) 5 MG TABS tablet TAKE 1 TABLET BY MOUTH DAILY BEFORE BREAKFAST. Patient taking differently: Take 5 mg by mouth daily. 09/12/21   [name], MD  furosemide (LASIX) 20 MG tablet Take 1 tablet (20 mg total) by mouth daily. 03/13/21   [name], MD  Iron, Ferrous Sulfate, 325 (65 Fe) MG TABS Take 325 mg by mouth daily. Patient not taking: Reported on 12/18/2021 12/22/20   Argentina Donovan, PA-C  isosorbide mononitrate (IMDUR) 60 MG 24 hr tablet Take 1 tablet (60 mg total) by mouth daily. Patient not taking: Reported on 12/18/2021 03/13/21   [name], MD  metoprolol succinate (TOPROL-XL) 25 MG 24 hr tablet Take 1 tablet (25 mg total) by mouth daily. 12/18/21 01/17/22  Nelta Numbers, MD  sacubitril-valsartan (ENTRESTO) 49-51 MG Take 1 tablet by mouth 2 (two) times daily. 03/13/21   [name], MD  spironolactone (ALDACTONE) 25 MG tablet TAKE 1 TABLET BY MOUTH EVERYDAY Patient taking differently: Take 25 mg by mouth daily. 03/13/21   [name], MD  tiZANidine (ZANAFLEX) 4 MG tablet Take 1 tablet (4 mg total) by mouth every 8 (eight) hours as needed for muscle spasms. Patient not taking: Reported on 12/18/2021 03/13/21   [name], MD      Allergies    Patient has no known allergies.    Review of Systems   Review of Systems  Physical Exam Updated Vital Signs BP (!) 169/88  Pulse 75   Temp 98.7 F (37.1 C) (Oral)   Resp 16   Ht 5\' 5"  (1.651 m)   Wt 98.9 kg   SpO2 98%   BMI 36.28 kg/m  Physical Exam Vitals and nursing note reviewed.  Constitutional:      General: She is not in acute distress.    Appearance: She is well-developed.  HENT:     Head: Normocephalic and atraumatic.     Right Ear: External ear normal.     Left Ear: External ear normal.     Nose: Nose normal.  Eyes:     Extraocular Movements: Extraocular movements intact.     Conjunctiva/sclera: Conjunctivae normal.     Pupils: Pupils are equal,  round, and reactive to light.  Pulmonary:     Effort: Pulmonary effort is normal. No respiratory distress.  Abdominal:     General: Abdomen is flat.     Palpations: Abdomen is soft.  Musculoskeletal:     Cervical back: Normal range of motion and neck supple.  Skin:    General: Skin is warm and dry.  Neurological:     Mental Status: She is alert.     Comments: MENTAL STATUS: AAOx3 CRANIAL NERVES: II: Pupils equal and reactive 3 mm BL, no RAPD, no VF deficits III, IV, VI: EOM intact, no gaze preference or deviation, no nystagmus. V: normal sensation to light touch in V1, V2, and V3 segments bilaterally VII: no facial weakness or asymmetry, no nasolabial fold flattening VIII: normal hearing to speech and finger friction IX, X: normal palatal elevation, no uvular deviation XI: 5/5 head turn and 5/5 shoulder shrug bilaterally XII: midline tongue protrusion MOTOR: 5/5 strength in R shoulder flexion, elbow flexion and extension, and grip strength. 5/5 strength in L shoulder flexion, elbow flexion and extension, and grip strength.  5/5 strength in R hip and knee flexion, knee extension, ankle plantar and dorsiflexion. 5/5 strength in L hip and knee flexion, knee extension, ankle plantar and dorsiflexion. SENSORY: Normal sensation to light touch in all extremities COORD: Normal finger to nose and heel to shin, no tremor, no dysmetria STATION: normal stance, no truncal ataxia GAIT: Normal   Psychiatric:        Mood and Affect: Mood normal.     ED Results / Procedures / Treatments   Labs (all labs ordered are listed, but only abnormal results are displayed) Labs Reviewed  COMPREHENSIVE METABOLIC PANEL - Abnormal; Notable for the following components:      Result Value   Potassium 3.4 (*)    Glucose, Bld 143 (*)    Calcium 8.2 (*)    Total Bilirubin 0.2 (*)    All other components within normal limits  CBC WITH DIFFERENTIAL/PLATELET - Abnormal; Notable for the following components:    Hemoglobin 8.1 (*)    HCT 28.6 (*)    MCV 68.8 (*)    MCH 19.5 (*)    MCHC 28.3 (*)    RDW 18.4 (*)    Lymphs Abs 0.4 (*)    All other components within normal limits    EKG EKG Interpretation  Date/Time:  Monday July 02 2022 10:00:33 EST Ventricular Rate:  93 PR Interval:  138 QRS Duration: 84 QT Interval:  344 QTC Calculation: 427 R Axis:   -48 Text Interpretation: Interpretation limited secondary to artifact Sinus rhythm with Premature supraventricular complexes Left anterior fascicular block Possible Anterior infarct , age undetermined Abnormal ECG When compared with ECG of 18-Dec-2021 20:22, Atrial  fibrillation has resolved Confirmed by Vonita Moss [phone]) on 07/02/2022 10:26:36 AM  Radiology CT Head Wo Contrast  Result Date: 07/02/2022 CLINICAL DATA:  Hypertension.  Headache. EXAM: CT HEAD WITHOUT CONTRAST TECHNIQUE: Contiguous axial images were obtained from the base of the skull through the vertex without intravenous contrast. RADIATION DOSE REDUCTION: This exam was performed according to the departmental dose-optimization program which includes automated exposure control, adjustment of the mA and/or kV according to patient size and/or use of iterative reconstruction technique. COMPARISON:  None Available. FINDINGS: Brain: No evidence of acute infarction, hemorrhage, hydrocephalus, extra-axial collection or mass lesion/mass effect. Incidentally noted is a partially empty sella. Vascular: No hyperdense vessel or unexpected calcification. Skull: Normal. Negative for fracture or focal lesion. Sinuses/Orbits: Pansinus mucosal thickening with occlusion of the of the bilateral sphenoethmoidal recesses, the left OMC, in the right frontal recess. There is complete opacification of the right frontal sinus. No middle ear or mastoid effusion. Bilateral orbits are normal in appearance. Other: None. IMPRESSION: 1. No acute intracranial abnormality. 2. Pansinus mucosal thickening with  occlusion of the bilateral sphenoethmoidal recesses, the left OMC, in the right frontal recess. Electronically Signed   By: Lorenza Cambridge M.D.   On: 07/02/2022 12:07    Procedures Procedures    Medications Ordered in ED Medications  carvedilol (COREG) tablet 12.5 mg (12.5 mg Oral Given 07/02/22 1107)  sacubitril-valsartan (ENTRESTO) 49-51 mg per tablet (1 tablet Oral Given 07/02/22 1107)    ED Course/ Medical Decision Making/ A&P                             Medical Decision Making Amount and/or Complexity of Data Reviewed Radiology: ordered.  Risk Prescription drug management.   Susan Hays is a 52 y.o. female with comorbidities that complicate the patient evaluation including hypertension and atrial fibrillation on Eliquis who presents to the elevated blood pressure and headache.   Initial Ddx:  Hypertensive emergency, ICH, uncontrolled hypertension, migraine  MDM:  Feel the patient may be having a migraine and has incidentally elevated blood pressure or blood pressure that is elevated because of her migraine.  This focal neurologic deficits but since she is on blood thinners with very elevated blood pressure will obtain CT of the head to ensure that she is not having ICH at this time.  No other symptoms of hypertensive emergency at this time.  Plan:  Labs CT head  ED Summary/Re-evaluation:  Patient underwent the following workup that was reassuring.  Blood pressure improved after she was given her home blood pressure medications.  Did talk to the patient and she stated that she was having URI symptoms recently.  Head CT did show possible sinusitis so we will give the patient Augmentin and have her follow-up with her primary doctor regarding her symptoms and need for resumption of her blood pressure medications.  She does state that she has a prescription for these so we will not prescribe again at this time.  This patient presents to the ED for concern of complaints listed  in HPI, this involves an extensive number of treatment options, and is a complaint that carries with it a high risk of complications and morbidity. Disposition including potential need for admission considered.   Dispo: DC Home. Return precautions discussed including, but not limited to, those listed in the AVS. Allowed pt time to ask questions which were answered fully prior to dc.  Records reviewed Outpatient Clinic Notes The  following labs were independently interpreted: CBC and show chronic anemia I independently reviewed the following imaging with scope of interpretation limited to determining acute life threatening conditions related to emergency care: CT Head and agree with the radiologist interpretation with the following exceptions: None I personally reviewed and interpreted the pt's EKG: see above for interpretation  I have reviewed the patients home medications and made adjustments as needed Social Determinants of health:  Difficulty with medications  Final Clinical Impression(s) / ED Diagnoses Final diagnoses:  Anemia, unspecified type  Secondary hypertension  Nonintractable headache, unspecified chronicity pattern, unspecified headache type  Sinusitis, unspecified chronicity, unspecified location    Rx / DC Orders ED Discharge Orders          Ordered    amoxicillin-clavulanate (AUGMENTIN) 875-125 MG tablet  Every 12 hours        07/02/22 1254              Fransico Meadow, MD 07/02/22 1945

## 2023-04-10 ENCOUNTER — Ambulatory Visit: Payer: Medicaid Other | Attending: Physician Assistant | Admitting: Physician Assistant

## 2023-04-10 VITALS — BP 190/93 | HR 72 | Temp 98.6°F | Ht 65.0 in | Wt 215.8 lb

## 2023-04-10 DIAGNOSIS — R7303 Prediabetes: Secondary | ICD-10-CM

## 2023-04-10 DIAGNOSIS — N951 Menopausal and female climacteric states: Secondary | ICD-10-CM | POA: Diagnosis not present

## 2023-04-10 DIAGNOSIS — Z7984 Long term (current) use of oral hypoglycemic drugs: Secondary | ICD-10-CM

## 2023-04-10 DIAGNOSIS — I5022 Chronic systolic (congestive) heart failure: Secondary | ICD-10-CM | POA: Diagnosis not present

## 2023-04-10 DIAGNOSIS — D508 Other iron deficiency anemias: Secondary | ICD-10-CM

## 2023-04-10 DIAGNOSIS — I110 Hypertensive heart disease with heart failure: Secondary | ICD-10-CM

## 2023-04-10 DIAGNOSIS — I5042 Chronic combined systolic (congestive) and diastolic (congestive) heart failure: Secondary | ICD-10-CM

## 2023-04-10 DIAGNOSIS — Z91199 Patient's noncompliance with other medical treatment and regimen due to unspecified reason: Secondary | ICD-10-CM

## 2023-04-10 LAB — POCT GLYCOSYLATED HEMOGLOBIN (HGB A1C): Hemoglobin A1C: 5.9 % — AB (ref 4.0–5.6)

## 2023-04-10 MED ORDER — APIXABAN 5 MG PO TABS: 5.0000 mg | ORAL_TABLET | Freq: Two times a day (BID) | ORAL | 1 refills | Status: DC

## 2023-04-10 MED ORDER — SPIRONOLACTONE 25 MG PO TABS: ORAL_TABLET | ORAL | 1 refills | Status: DC

## 2023-04-10 MED ORDER — AMLODIPINE BESYLATE 10 MG PO TABS: 10.0000 mg | ORAL_TABLET | Freq: Every day | ORAL | 1 refills | Status: DC

## 2023-04-10 MED ORDER — METOPROLOL SUCCINATE ER 25 MG PO TB24: 25.0000 mg | ORAL_TABLET | Freq: Every day | ORAL | 1 refills | Status: DC

## 2023-04-10 MED ORDER — SACUBITRIL-VALSARTAN 49-51 MG PO TABS: 1.0000 | ORAL_TABLET | Freq: Two times a day (BID) | ORAL | 1 refills | Status: DC

## 2023-04-10 MED ORDER — FUROSEMIDE 20 MG PO TABS: 20.0000 mg | ORAL_TABLET | Freq: Every day | ORAL | 1 refills | Status: DC

## 2023-04-10 MED ORDER — DAPAGLIFLOZIN PROPANEDIOL 5 MG PO TABS: 5.0000 mg | ORAL_TABLET | Freq: Every day | ORAL | 1 refills | Status: DC

## 2023-04-10 MED ORDER — IRON (FERROUS SULFATE) 325 (65 FE) MG PO TABS: 325.0000 mg | ORAL_TABLET | Freq: Every day | ORAL | 4 refills | Status: AC

## 2023-04-10 NOTE — Progress Notes (Signed)
Patient ID: Susan Hays, female   DOB: 12/20/1970, 52 y.o.   MRN: [id number]   Susan Hays, is a 52 y.o. female  [id number]  [id number]  DOB - 10-04-1970  Chief Complaint  Patient presents with   Medication Refill       Subjective:   Susan Hays is a 52 y.o. female here today for med RF.  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  She thinks she may be perimenopausal bc her periods are not regular over the last year.  She does have mild episodes of "feeling hot at times."  She has not been taking OTC iron.  She does have a BP cuff at home.  Does not follow low sugar diet.    No problems updated.  ALLERGIES: No Known Allergies  PAST MEDICAL HISTORY: Past Medical History:  Diagnosis Date   Hypertension     MEDICATIONS AT HOME: Prior to Admission medications   Medication Sig Start Date End Date Taking? Authorizing Provider  acetaminophen (TYLENOL) 500 MG tablet Take 1,000 mg by mouth every 6 (six) hours as needed for moderate pain.    [provider]  amLODipine (NORVASC) 10 MG tablet Take 1 tablet (10 mg total) by mouth daily. 04/10/23   Anders Simmonds, PA-C  apixaban (ELIQUIS) 5 MG TABS tablet Take 1 tablet (5 mg total) by mouth 2 (two) times daily. 04/10/23 05/10/23  Anders Simmonds, PA-C  dapagliflozin propanediol (FARXIGA) 5 MG TABS tablet Take 1 tablet (5 mg total) by mouth daily before breakfast. 04/10/23   Anders Simmonds, PA-C  furosemide (LASIX) 20 MG tablet Take 1 tablet (20 mg total) by mouth daily. 04/10/23   Anders Simmonds, PA-C  Iron, Ferrous Sulfate, 325 (65 Fe) MG TABS Take 325 mg by mouth daily. 04/10/23   Anders Simmonds, PA-C  metoprolol succinate (TOPROL-XL) 25 MG 24 hr tablet Take 1 tablet (25 mg total) by mouth daily. 04/10/23 05/10/23  Anders Simmonds, PA-C  sacubitril-valsartan (ENTRESTO) 49-51 MG Take 1 tablet by mouth 2 (two) times daily. 04/10/23   Anders Simmonds, PA-C  spironolactone (ALDACTONE) 25 MG tablet TAKE 1 TABLET BY MOUTH EVERYDAY 04/10/23   Georgian Co M, PA-C  tiZANidine (ZANAFLEX) 4 MG tablet Take 1 tablet (4 mg total) by mouth every 8 (eight) hours as needed for muscle spasms. Patient not taking: Reported on 12/18/2021 03/13/21   Hoy Register, MD    ROS: Neg HEENT Neg resp Neg cardiac Neg GI Neg GU Neg MS Neg psych Neg neuro  Objective:   Vitals:   04/10/23 1105  BP: (!) 190/93  Pulse: 72  Temp: 98.6 F (37 C)  SpO2: 99%  Weight: 215 lb 12.8 oz (97.9 kg)  Height: 5\' 5"  (1.651 m)   Exam General appearance : Awake, alert, not in any distress. Speech Clear. Not toxic looking HEENT: Atraumatic and Normocephalic Neck: Supple, no JVD. No cervical lymphadenopathy.  Chest: Good air entry bilaterally, CTAB.  No rales/rhonchi/wheezing CVS: S1 S2 regular, no murmurs.  Extremities: B/L Lower Ext shows no edema, both legs are warm to touch Neurology: Awake alert, and oriented X 3, CN II-XII intact, Non focal Skin: No Rash  Data Review Lab Results  Component Value Date   HGBA1C 6.0 (H) 12/21/2020   HGBA1C 6.2 03/30/2020    Assessment & Plan   1. 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  Check BP daily and record and follow up by virtual with Franky Macho - furosemide (LASIX) 20 MG tablet; Take 1 tablet (20 mg total) by mouth daily.  Dispense: 90 tablet; Refill: 1 - sacubitril-valsartan (ENTRESTO) 49-51 MG; Take 1 tablet by mouth 2 (two) times daily.  Dispense: 180 tablet; Refill: 1 - spironolactone (ALDACTONE) 25 MG tablet; TAKE 1 TABLET BY MOUTH EVERYDAY  Dispense: 90 tablet; Refill: 1 - dapagliflozin propanediol (FARXIGA) 5 MG TABS tablet; Take 1 tablet (5 mg total) by mouth daily before breakfast.  Dispense: 90 tablet; Refill: 1 - amLODipine (NORVASC) 10 MG tablet; Take 1 tablet (10 mg total) by  mouth daily.  Dispense: 90 tablet; Refill: 1 - Comprehensive metabolic panel - Lipid Panel  2. Prediabetes Improved-work on low sugar and low starch - HgB A1c - dapagliflozin propanediol (FARXIGA) 5 MG TABS tablet; Take 1 tablet (5 mg total) by mouth daily before breakfast.  Dispense: 90 tablet; Refill: 1 - Lipid Panel  3. Perimenopausal - FSH/LH  4. Chronic combined systolic (congestive) and diastolic (congestive) heart failure (HCC) - furosemide (LASIX) 20 MG tablet; Take 1 tablet (20 mg total) by mouth daily.  Dispense: 90 tablet; Refill: 1 - sacubitril-valsartan (ENTRESTO) 49-51 MG; Take 1 tablet by mouth 2 (two) times daily.  Dispense: 180 tablet; Refill: 1 - spironolactone (ALDACTONE) 25 MG tablet; TAKE 1 TABLET BY MOUTH EVERYDAY  Dispense: 90 tablet; Refill: 1 - apixaban (ELIQUIS) 5 MG TABS tablet; Take 1 tablet (5 mg total) by mouth 2 (two) times daily.  Dispense: 180 tablet; Refill: 1 - metoprolol succinate (TOPROL-XL) 25 MG 24 hr tablet; Take 1 tablet (25 mg total) by mouth daily.  Dispense: 90 tablet; Refill: 1 - Lipid Panel  5. Noncompliance Compliance imperative  6. Other iron deficiency anemia - Iron, Ferrous Sulfate, 325 (65 Fe) MG TABS; Take 325 mg by mouth daily.  Dispense: 30 tablet; Refill: 4 - CBC with Differential - Iron, TIBC and Ferritin Panel    Return for Luke-3 to 4 weeks for BP(on phone) and 6 motnhs with PCP/[facility].  The patient was given clear instructions to go to ER or return to medical center if symptoms don't improve, worsen or new problems develop. The patient verbalized understanding. The patient was told to call to get lab results if they haven't heard anything in the next week.      Georgian Co, PA-C [facility] and [facility] [place], [place] [phone]   04/10/2023, 11:35 AM

## 2023-04-10 NOTE — Patient Instructions (Signed)
Please check your blood pressures and record daily

## 2023-04-11 LAB — CBC WITH DIFFERENTIAL/PLATELET
Basophils Absolute: 0.1 10*3/uL (ref 0.0–0.2)
Basos: 1 %
EOS (ABSOLUTE): 0.1 10*3/uL (ref 0.0–0.4)
Eos: 1 %
Hematocrit: 35.3 % (ref 34.0–46.6)
Hemoglobin: 10 g/dL — ABNORMAL LOW (ref 11.1–15.9)
Immature Grans (Abs): 0 10*3/uL (ref 0.0–0.1)
Immature Granulocytes: 0 %
Lymphocytes Absolute: 1.3 10*3/uL (ref 0.7–3.1)
Lymphs: 18 %
MCH: 21.5 pg — ABNORMAL LOW (ref 26.6–33.0)
MCHC: 28.3 g/dL — ABNORMAL LOW (ref 31.5–35.7)
MCV: 76 fL — ABNORMAL LOW (ref 79–97)
Monocytes Absolute: 0.7 10*3/uL (ref 0.1–0.9)
Monocytes: 10 %
Neutrophils Absolute: 4.9 10*3/uL (ref 1.4–7.0)
Neutrophils: 70 %
Platelets: 325 10*3/uL (ref 150–450)
RBC: 4.66 x10E6/uL (ref 3.77–5.28)
RDW: 17.1 % — ABNORMAL HIGH (ref 11.7–15.4)
WBC: 7.1 10*3/uL (ref 3.4–10.8)

## 2023-04-11 LAB — COMPREHENSIVE METABOLIC PANEL
ALT: 15 [IU]/L (ref 0–32)
AST: 25 [IU]/L (ref 0–40)
Albumin: 4.1 g/dL (ref 3.8–4.9)
Alkaline Phosphatase: 124 [IU]/L — ABNORMAL HIGH (ref 44–121)
BUN/Creatinine Ratio: 22 (ref 9–23)
BUN: 18 mg/dL (ref 6–24)
Bilirubin Total: <0.2 mg/dL (ref 0.0–1.2)
CO2: 25 mmol/L (ref 20–29)
Calcium: 9.6 mg/dL (ref 8.7–10.2)
Chloride: 100 mmol/L (ref 96–106)
Creatinine, Ser: 0.81 mg/dL (ref 0.57–1.00)
Globulin, Total: 3.5 g/dL (ref 1.5–4.5)
Glucose: 108 mg/dL — ABNORMAL HIGH (ref 70–99)
Potassium: 4.9 mmol/L (ref 3.5–5.2)
Sodium: 138 mmol/L (ref 134–144)
Total Protein: 7.6 g/dL (ref 6.0–8.5)
eGFR: 87 mL/min/{1.73_m2} (ref >59)

## 2023-04-11 LAB — IRON,TIBC AND FERRITIN PANEL
Ferritin: 8 ng/mL — ABNORMAL LOW (ref 15–150)
Iron Saturation: 5 % — CL (ref 15–55)
Iron: 25 ug/dL — ABNORMAL LOW (ref 27–159)
Total Iron Binding Capacity: 474 ug/dL — ABNORMAL HIGH (ref 250–450)
UIBC: 449 ug/dL — ABNORMAL HIGH (ref 131–425)

## 2023-04-11 LAB — LIPID PANEL
Chol/HDL Ratio: 2.2 ratio (ref 0.0–4.4)
Cholesterol, Total: 211 mg/dL — ABNORMAL HIGH (ref 100–199)
HDL: 94 mg/dL (ref >39)
LDL Chol Calc (NIH): 107 mg/dL — ABNORMAL HIGH (ref 0–99)
Triglycerides: 56 mg/dL (ref 0–149)
VLDL Cholesterol Cal: 10 mg/dL (ref 5–40)

## 2023-04-11 LAB — FSH/LH
FSH: 73 m[IU]/mL
LH: 51.5 m[IU]/mL

## 2023-04-12 ENCOUNTER — Telehealth: Payer: Self-pay

## 2023-04-12 NOTE — Telephone Encounter (Signed)
Pt was called and is aware of results, DOB was confirmed.  ?

## 2023-04-12 NOTE — Telephone Encounter (Signed)
-----   Message from Georgian Co sent at 04/11/2023  8:43 AM EDT ----- Please call patient.  Resume iron tablets as discussed at her visit.  Also, resume other meds as we discussed.  She is still anemic but not as bad as in the past.  Drink adequate water.  Her labs are consistent with perimenopause.  Cholesterol a little high but resuming meds will help.  Kidney and liver function are within normal limits.  Thanks, Georgian Co, PA-C

## 2023-05-05 NOTE — Progress Notes (Unsigned)
   S:     No chief complaint on file.  52 y.o. female who presents for hypertension evaluation, education, and management.  PMH is significant for CHF, Hypertensive crisis, and HTN. Patient was referred and last seen by Provider, Georgian Co, on 04/10/2023. At that visit BP was 190/93, Pulse:72. Patient mentioned she does have a BP cuff at home. Patient was encouraged to check BP at home   At last visit, ***.   Today, patient arrives in *** spirits and presents without *** assistance. *** Denies dizziness, headache, blurred vision, swelling.   Patient reports hypertension was diagnosed in ***.   Family/Social history: ***  Medication adherence *** . Patient has *** taken BP medications today.   Current antihypertensives include: Amlodipine 10 mg daily, Metoprolol succinate (Toprol-XL) 25 mg daily, Spironolactone 25 mg    Antihypertensives tried in the past include: ***  Reported home BP readings: ***  Patient reported dietary habits: Eats *** meals/day Breakfast: *** Lunch: *** Dinner: *** Snacks: *** Drinks: ***  Patient-reported exercise habits: ***  ASCVD risk factors include: ***  O:   Last 3 Office BP readings: BP Readings from Last 3 Encounters:  04/10/23 (!) 190/93  07/02/22 (!) 169/88  12/18/21 104/61    BMET    Component Value Date/Time   NA 138 04/10/2023 1145   K 4.9 04/10/2023 1145   CL 100 04/10/2023 1145   CO2 25 04/10/2023 1145   GLUCOSE 108 (H) 04/10/2023 1145   GLUCOSE 143 (H) 07/02/2022 1023   BUN 18 04/10/2023 1145   CREATININE 0.81 04/10/2023 1145   CALCIUM 9.6 04/10/2023 1145   GFRNONAA >60 07/02/2022 1023   GFRAA >60 03/04/2020 1114    Renal function: CrCl cannot be calculated (Patient's most recent lab result is older than the maximum 21 days allowed.).  Clinical ASCVD: {YES/NO:21197} The 10-year ASCVD risk score (Arnett DK, et al., 2019) is: 10.4%   Values used to calculate the score:     Age: 48 years     Sex: Female      Is Non-Hispanic African American: Yes     Diabetic: No     Tobacco smoker: No     Systolic Blood Pressure: 190 mmHg     Is BP treated: Yes     HDL Cholesterol: 94 mg/dL     Total Cholesterol: 211 mg/dL  Patient is participating in a Managed Medicaid Plan:  {MM YES/NO:27447::"Yes"}    A/P: Hypertension diagnosed *** currently *** on current medications. BP goal < 130/80 *** mmHg. Medication adherence appears ***. Control is suboptimal due to ***.  -{Meds adjust:18428} ***.  -{Meds adjust:18428} ***.  -Patient educated on purpose, proper use, and potential adverse effects of ***.  -F/u labs ordered - *** -Counseled on lifestyle modifications for blood pressure control including reduced dietary sodium, increased exercise, adequate sleep. -Encouraged patient to check BP at home and bring log of readings to next visit. Counseled on proper use of home BP cuff.   Results reviewed and written information provided.    Written patient instructions provided. Patient verbalized understanding of treatment plan.  Total time in face to face counseling *** minutes.    Follow-up:  Pharmacist ***. PCP clinic visit: 10/09/2023.  Patient seen with ***

## 2023-05-06 ENCOUNTER — Telehealth: Payer: Medicaid Other | Admitting: Pharmacist

## 2023-05-31 ENCOUNTER — Other Ambulatory Visit: Payer: Self-pay

## 2023-05-31 ENCOUNTER — Ambulatory Visit: Payer: Medicaid Other | Attending: Family Medicine | Admitting: Pharmacist

## 2023-05-31 ENCOUNTER — Encounter: Payer: Self-pay | Admitting: Pharmacist

## 2023-05-31 VITALS — BP 169/82

## 2023-05-31 DIAGNOSIS — I110 Hypertensive heart disease with heart failure: Secondary | ICD-10-CM

## 2023-05-31 DIAGNOSIS — I5042 Chronic combined systolic (congestive) and diastolic (congestive) heart failure: Secondary | ICD-10-CM | POA: Diagnosis not present

## 2023-05-31 NOTE — Progress Notes (Signed)
S:     No chief complaint on file.  52 y.o. female who presents for hypertension evaluation, education, and management.  PMH is significant for HTN, chronic combined systolic/diastolic CHF (last EF 25-30% in 2021). Patient was referred and last seen by Georgian Co on 04/10/2023. BP was 190/93 mmHg at that visit.   Today, patient arrives in good spirits and presents without assistance. Denies dizziness, headache, blurred vision, swelling. Patient reports hypertension is longstanding. Struggles with medication adherence d/t her work schedule. I confirmed via her dispense report that she filled 90-ds of most of her antihypertensives 04/10/23. Of note, she has not filled Entresto since 2023.   Family/Social history:  Fhx:  Tobacco: never smoker   Alcohol: does not drink alcohol  Medication adherence reported with all anti-hypertensives except for Entresto. Patient has taken BP medications today.   Current antihypertensives include: amlodipine 10 mg daily, Entresto 49-51 mg BID, metoprolol succinate 25mg  daily, spironolactone 25 mg daily. **Of note, on Lasix 20 mg daily for fluid  Reported home BP readings: none  Patient reported dietary habits: compliant with sodium restriction. Drinks only a small amount of caffeinated beverages in the morning.   Patient-reported exercise habits: none outside of work. Very active at work.  O:  ROS  Physical Exam  Last 3 Office BP readings: BP Readings from Last 3 Encounters:  05/31/23 (!) 169/82  04/10/23 (!) 190/93  07/02/22 (!) 169/88    BMET    Component Value Date/Time   NA 138 04/10/2023 1145   K 4.9 04/10/2023 1145   CL 100 04/10/2023 1145   CO2 25 04/10/2023 1145   GLUCOSE 108 (H) 04/10/2023 1145   GLUCOSE 143 (H) 07/02/2022 1023   BUN 18 04/10/2023 1145   CREATININE 0.81 04/10/2023 1145   CALCIUM 9.6 04/10/2023 1145   GFRNONAA >60 07/02/2022 1023   GFRAA >60 03/04/2020 1114    Renal function: CrCl cannot be calculated  (Patient's most recent lab result is older than the maximum 21 days allowed.).  Clinical ASCVD: No  The 10-year ASCVD risk score (Arnett DK, et al., 2019) is: 6.8%   Values used to calculate the score:     Age: 19 years     Sex: Female     Is Non-Hispanic African American: Yes     Diabetic: No     Tobacco smoker: No     Systolic Blood Pressure: 169 mmHg     Is BP treated: Yes     HDL Cholesterol: 94 mg/dL     Total Cholesterol: 211 mg/dL  Patient is participating in a Managed Medicaid Plan:  Yes    A/P: Hypertension diagnosed currently uncontrolled on current medications. BP goal < 130/80 mmHg. Medication adherence appears to be okay, but she has been without Entresto. I contacted pharmacy and found that her insurance was not being billed properly. Pharmacy corrected the issue and now she is able to fill for $4. Will follow-up in 1 month for BP recheck on all medication.  -Continued current regimen. Filled Entresto.   -Counseled on lifestyle modifications for blood pressure control including reduced dietary sodium, increased exercise, adequate sleep. -Encouraged patient to check BP at home and bring log of readings to next visit. Counseled on proper use of home BP cuff.   Results reviewed and written information provided.    Written patient instructions provided. Patient verbalized understanding of treatment plan.  Total time in face to face counseling 20 minutes.    Follow-up:  Pharmacist in  1 month.  Butch Penny, PharmD, P[place]y [place], CPP Clinical Pharmacist [facility] [phone]

## 2023-06-30 NOTE — Progress Notes (Unsigned)
S:     No chief complaint on file.  53 y.o. female who presents for hypertension evaluation, education, and management.  PMH is significant for HTN, chronic combined systolic/diastolic CHF (last EF 25-30% in 2021), prediabetes. Patient was referred and last seen by Georgian Co on 04/10/2023. BP was 190/93 mmHg at that visit. At last pharmacist visit on 05/31/23, BP was 169/82. Pt was restarted on her Entresto, after confirming coapy of $4/mo with the pharmacy. All other medications were continued.   Today, patient arrives in good spirits and presents without assistance. ****Denies dizziness, headache, blurred vision, swelling.    OLD: Patient reports hypertension is longstanding. Struggles with medication adherence d/t her work schedule. I confirmed via her dispense report that she filled 90-ds of most of her antihypertensives 04/10/23. Of note, she has not filled Entresto since 2023.   Family/Social history:  Fhx:  Tobacco: never smoker   Alcohol: does not drink alcohol  Medication adherence reported with all anti-hypertensives except for Entresto. Patient has taken BP medications today.   Current antihypertensives include: amlodipine 10 mg daily, Entresto 49-51 mg BID, metoprolol succinate 25mg  daily, spironolactone 25 mg daily. **Of note, on Lasix 20 mg daily for fluid  Reported home BP readings: none  Patient reported dietary habits: compliant with sodium restriction. Drinks only a small amount of caffeinated beverages in the morning.   Patient-reported exercise habits: none outside of work. Very active at work.  O:  ROS  Physical Exam  Last 3 Office BP readings: BP Readings from Last 3 Encounters:  05/31/23 (!) 169/82  04/10/23 (!) 190/93  07/02/22 (!) 169/88    BMET    Component Value Date/Time   NA 138 04/10/2023 1145   K 4.9 04/10/2023 1145   CL 100 04/10/2023 1145   CO2 25 04/10/2023 1145   GLUCOSE 108 (H) 04/10/2023 1145   GLUCOSE 143 (H) 07/02/2022  1023   BUN 18 04/10/2023 1145   CREATININE 0.81 04/10/2023 1145   CALCIUM 9.6 04/10/2023 1145   GFRNONAA >60 07/02/2022 1023   GFRAA >60 03/04/2020 1114    Renal function: CrCl cannot be calculated (Patient's most recent lab result is older than the maximum 21 days allowed.).  Clinical ASCVD: No  The 10-year ASCVD risk score (Arnett DK, et al., 2019) is: 7.4%   Values used to calculate the score:     Age: 59 years     Sex: Female     Is Non-Hispanic African American: Yes     Diabetic: No     Tobacco smoker: No     Systolic Blood Pressure: 169 mmHg     Is BP treated: Yes     HDL Cholesterol: 94 mg/dL     Total Cholesterol: 211 mg/dL  Patient is participating in a Managed Medicaid Plan:  Yes   BMP today - increase Entresto if needed (confirm fill, vs add hydrochlorothiazide or switch metoprolol to carvedilol.  Needs repeat echo. 4th choice - hydralazine/isosorbide Last BMP 04/10/23: Cr 0.81, K 4.9, ALP elevated Supposed to be on apixaban? Marcelline Deist?  - not filled since 2023  LDL-C: 107 mg/dL   A/P: Hypertension diagnosed currently uncontrolled on current medications. BP goal < 130/80 mmHg. Medication adherence appears to be okay, but she has been without Entresto. I contacted pharmacy and found that her insurance was not being billed properly. Pharmacy corrected the issue and now she is able to fill for $4. Will follow-up in 1 month for BP recheck on all medication.  -  Continued current regimen. Filled Entresto.   -Counseled on lifestyle modifications for blood pressure control including reduced dietary sodium, increased exercise, adequate sleep. -Encouraged patient to check BP at home and bring log of readings to next visit. Counseled on proper use of home BP cuff.   Results reviewed and written information provided.    Written patient instructions provided. Patient verbalized understanding of treatment plan.  Total time in face to face counseling 20 minutes.    Follow-up:   Pharmacist in 1 month. PCP 10/09/23  Nils Pyle, PharmD PGY1 Pharmacy Resident  Butch Penny, PharmD, BCACP, CPP Clinical Pharmacist [facility] [phone]

## 2023-07-01 ENCOUNTER — Ambulatory Visit: Payer: Medicaid Other | Attending: Nurse Practitioner | Admitting: Pharmacist

## 2023-07-01 ENCOUNTER — Encounter: Payer: Self-pay | Admitting: Pharmacist

## 2023-07-01 VITALS — BP 189/84 | HR 79

## 2023-07-01 DIAGNOSIS — I10 Essential (primary) hypertension: Secondary | ICD-10-CM

## 2023-07-01 DIAGNOSIS — I5042 Chronic combined systolic (congestive) and diastolic (congestive) heart failure: Secondary | ICD-10-CM | POA: Diagnosis not present

## 2023-07-01 MED ORDER — METOPROLOL SUCCINATE ER 25 MG PO TB24: 25.0000 mg | ORAL_TABLET | Freq: Every day | ORAL | 1 refills | Status: DC

## 2023-07-01 MED ORDER — ENTRESTO 97-103 MG PO TABS: 1.0000 | ORAL_TABLET | Freq: Two times a day (BID) | ORAL | 3 refills | Status: DC

## 2023-07-01 MED ORDER — APIXABAN 5 MG PO TABS: 5.0000 mg | ORAL_TABLET | Freq: Two times a day (BID) | ORAL | 1 refills | Status: DC

## 2023-07-01 NOTE — Patient Instructions (Addendum)
It was great to see you today - I hope you feel better.   Please continue:  - Amlodipine 10 mg once daily - Metoprolol succinate 25 mg once daily - Spironolactone 25 mg once daily - Furosemide 20 mg once daily  We would like you to INCREASE Entresto to the next higher dose of 97-103 mg by mouth twice daily. We have sent this medication to your pharmacy.   Let us know if you have any questions or concerns.

## 2023-07-02 ENCOUNTER — Other Ambulatory Visit: Payer: Self-pay | Admitting: Family Medicine

## 2023-07-02 DIAGNOSIS — I5042 Chronic combined systolic (congestive) and diastolic (congestive) heart failure: Secondary | ICD-10-CM

## 2023-07-02 LAB — CMP14+EGFR
ALT: 15 [IU]/L (ref 0–32)
AST: 19 [IU]/L (ref 0–40)
Albumin: 4.3 g/dL (ref 3.8–4.9)
Alkaline Phosphatase: 144 [IU]/L — ABNORMAL HIGH (ref 44–121)
BUN/Creatinine Ratio: 13 (ref 9–23)
BUN: 12 mg/dL (ref 6–24)
Bilirubin Total: 0.2 mg/dL (ref 0.0–1.2)
CO2: 25 mmol/L (ref 20–29)
Calcium: 9.2 mg/dL (ref 8.7–10.2)
Chloride: 100 mmol/L (ref 96–106)
Creatinine, Ser: 0.9 mg/dL (ref 0.57–1.00)
Globulin, Total: 3.8 g/dL (ref 1.5–4.5)
Glucose: 132 mg/dL — ABNORMAL HIGH (ref 70–99)
Potassium: 4 mmol/L (ref 3.5–5.2)
Sodium: 139 mmol/L (ref 134–144)
Total Protein: 8.1 g/dL (ref 6.0–8.5)
eGFR: 76 mL/min/{1.73_m2} (ref >59)

## 2023-07-21 NOTE — Progress Notes (Signed)
S:     Chief Complaint  Patient presents with   Hypertension   53 y.o. female who presents for hypertension evaluation, education, and management.  PMH is significant for HTN, chronic combined systolic/diastolic CHF (last EF 25-30% in 2021), prediabetes. Patient was referred and last seen by Georgian Co on 04/10/2023. BP was 190/93 mmHg at that visit. At last pharmacist visit on 05/31/23, BP was 169/82. Pt was restarted on her Sherryll Burger, after confirming copay of $4/mo with the pharmacy. All other medications were continued. Patient has had poor follow-up and continuity of care.   At last appointment on 07/01/23, BP was elevated to 189/84 and she was instructed to increase Entresto to 97-103 mg PO BID and restart Eliquis, given history of Afib and risk of stroke.  Today, patient arrives in good spirits and presents without assistance. Denies dizziness, headache, blurred vision, swelling, ShOB. Endorses some CP with exertion when she was rushing to the bus. She increased Entresto and has not noticed any AE. She also was able to restart Eliquis - denies abnormal bleeding/bruising. She has not yet started an iron supplement. She does not recall why she is not still taking SGLT2i [place]. Patient works full time at a nursing home.    Family/Social history:  Fhx: No pertinent positives.  Tobacco: never smoker   Alcohol: does not drink alcohol  Medication adherence is adequate. Patient has taken BP medications today.   Current antihypertensives include: amlodipine 10 mg daily, Entresto 97-103 mg BID, metoprolol succinate 25mg  daily, spironolactone 25 mg daily, furosemide 20 mg PO daily   Reported home BP readings: Has a blood pressure cuff (wrist cuff) at home - has not checked recently.   Patient reported dietary habits: compliant with sodium restriction. Drinks only a small amount of caffeinated beverages in the morning (small amount of Pepsi this AM).   Patient-reported exercise habits:  none outside of work. Very active at work.  O:  ROS  Physical Exam  This patients CHA2DS2-VASc Score and unadjusted Ischemic Stroke Rate (% per year) is equal to 3.2 % stroke rate/year from a score of 3 (female, CHF history)   Last 3 Office BP readings: BP Readings from Last 3 Encounters:  07/22/23 (!) 147/80  07/01/23 (!) 189/84  05/31/23 (!) 169/82    BMET    Component Value Date/Time   NA 139 07/01/2023 1039   K 4.0 07/01/2023 1039   CL 100 07/01/2023 1039   CO2 25 07/01/2023 1039   GLUCOSE 132 (H) 07/01/2023 1039   GLUCOSE 143 (H) 07/02/2022 1023   BUN 12 07/01/2023 1039   CREATININE 0.90 07/01/2023 1039   CALCIUM 9.2 07/01/2023 1039   GFRNONAA >60 07/02/2022 1023   GFRAA >60 03/04/2020 1114    Renal function: CrCl cannot be calculated (Patient's most recent lab result is older than the maximum 21 days allowed.).  Clinical ASCVD: No  The 10-year ASCVD risk score (Arnett DK, et al., 2019) is: 4.5%   Values used to calculate the score:     Age: 53 years     Sex: Female     Is Non-Hispanic African American: Yes     Diabetic: No     Tobacco smoker: No     Systolic Blood Pressure: 147 mmHg     Is BP treated: Yes     HDL Cholesterol: 94 mg/dL     Total Cholesterol: 211 mg/dL  Patient is participating in a Managed Medicaid Plan:  Yes   A/P:  Hypertension  with HFrEF diagnosed currently uncontrolled above goal < 130/80 mmHg, but improved from since increasing Entresto.  Patient is due for repeat BMP today after increasing ARNI. For further BP control, will transition patient form metoprolol to carvedilol, with plan to titrate to optimize HFrEF GDMT. She is euvolemic today. She continues to be a good candidate to resume SGLT2i for HFrEF - will consider restarting once BP is stable. -STOP metoprolol succinate. START carvedilol 6.25 mg PO BID with food.  -Continue Entresto (sacubitril/valsartan) 97-103 mg PO BID -Continue amlodipine 10 mg PO daily, spironolactone 25 mg  PO daily, and furosemide 20 mg PO daily -Counseled on lifestyle modifications for blood pressure control including reduced dietary sodium, increased exercise, adequate sleep. -Encouraged patient to check BP at home and bring log of readings to next visit. Counseled on proper use of home BP cuff.  - Advised patient to buy ferrous sulfate 325 mg tablets over the counter at the pharmacy, given severe iron deficiency. Patient reported that she would not be able to go to the pharmacy today, but will buy the supplement next time she is at [facility].   Atrial fibrillation - Diagnosed in 2023 without proper follow-up. Patient is eligible for anticoagulation with CHA2DS2VASC of 3. Patient successfully restarted Eliquis after last appt and denies abnormal bleeding/bruising. Hgb low due to IDA, and plt WNL. Patient has been scheduled with cardiology for follow-up. - Continue Eliquis (apixaban) 5 mg PO BID  Results reviewed and written information provided.    Written patient instructions provided. Patient verbalized understanding of treatment plan.  Total time in face to face counseling 20 minutes.    Follow-up:  Pharmacist in 1 month - 08/26/23 Cardiology 09/13/23 PCP 10/09/23  Nils Pyle, PharmD PGY1 Pharmacy Resident  Butch Penny, PharmD, BCACP, CPP Clinical Pharmacist [facility] [phone]

## 2023-07-22 ENCOUNTER — Ambulatory Visit: Payer: Medicaid Other | Attending: Family Medicine | Admitting: Pharmacist

## 2023-07-22 ENCOUNTER — Encounter: Payer: Self-pay | Admitting: Pharmacist

## 2023-07-22 VITALS — BP 147/80 | HR 73

## 2023-07-22 DIAGNOSIS — I5042 Chronic combined systolic (congestive) and diastolic (congestive) heart failure: Secondary | ICD-10-CM

## 2023-07-22 DIAGNOSIS — I169 Hypertensive crisis, unspecified: Secondary | ICD-10-CM

## 2023-07-22 MED ORDER — CARVEDILOL 6.25 MG PO TABS: 6.2500 mg | ORAL_TABLET | Freq: Two times a day (BID) | ORAL | 1 refills | Status: DC

## 2023-07-22 NOTE — Patient Instructions (Addendum)
 It was great to see you today! Your blood pressure is going in the right direction - you are doing a great job.   Please check your blood pressure at home 2-3 times per week and bring your cuff or log of readings with you next time.   Continue amlodipine  10 mg daily, Entresto  97-103 mg twice daily, and spironolactone  25 mg daily  STOP metoprolol  25 mg daily and START carvedilol  6.25 mg TWICE DAILY

## 2023-07-23 LAB — BASIC METABOLIC PANEL
BUN/Creatinine Ratio: 24 — ABNORMAL HIGH (ref 9–23)
BUN: 19 mg/dL (ref 6–24)
CO2: 22 mmol/L (ref 20–29)
Calcium: 9.4 mg/dL (ref 8.7–10.2)
Chloride: 100 mmol/L (ref 96–106)
Creatinine, Ser: 0.78 mg/dL (ref 0.57–1.00)
Glucose: 100 mg/dL — ABNORMAL HIGH (ref 70–99)
Potassium: 4.4 mmol/L (ref 3.5–5.2)
Sodium: 139 mmol/L (ref 134–144)
eGFR: 90 mL/min/{1.73_m2} (ref >59)

## 2023-08-25 NOTE — Progress Notes (Deleted)
 S:     No chief complaint on file.  53 y.o. female who presents for hypertension evaluation, education, and management.  PMH is significant for HTN, chronic combined systolic/diastolic CHF (last EF 25-30% in 2021), prediabetes. Patient was referred and last seen by Georgian Co on 04/10/2023. BP was 190/93 mmHg at that visit. At pharmacist visit on 05/31/23, BP was 169/82. Pt was restarted on her Sherryll Burger, after confirming copay of $4/mo with the pharmacy. Entresto was subsequently titrated to 97-103 mg BID. Patient was also restarted on Eliquis for AF stroke prevention. At last appointment on 07/22/23 - she was instructed to transition from metoprolol succinate to carvedilol for additional BP control. Patient has had poor follow-up and continuity of care - but has a cardiology appt scheduled on 09/13/23.  Today, patient arrives in good spirits and presents without assistance. Denies dizziness, headache, blurred vision, swelling, ShOB.***  OLD: Endorses some CP with exertion when she was rushing to the bus. She increased Entresto and has not noticed any AE. She also was able to restart Eliquis - denies abnormal bleeding/bruising. She has not yet started an iron supplement. She does not recall why she is not still taking SGLT2i [place]. Patient works full time at a nursing home.    Family/Social history:  Fhx: No pertinent positives.  Tobacco: never smoker   Alcohol: does not drink alcohol  Medication adherence is adequate. Patient has taken BP medications today.   Current antihypertensives include: amlodipine 10 mg daily, Entresto 97-103 mg BID, carvedilol 6.25 mg BID***, spironolactone 25 mg daily, furosemide 20 mg PO daily   Reported home BP readings: Has a blood pressure cuff (wrist cuff) at home - has not checked recently.   Patient reported dietary habits: compliant with sodium restriction. Drinks only a small amount of caffeinated beverages in the morning (small amount of Pepsi this  AM).   Patient-reported exercise habits: none outside of work. Very active at work.  O:  ROS  Physical Exam  This patients CHA2DS2-VASc Score and unadjusted Ischemic Stroke Rate (% per year) is equal to 3.2 % stroke rate/year from a score of 3 (female, CHF history)   Last 3 Office BP readings: BP Readings from Last 3 Encounters:  07/22/23 (!) 147/80  07/01/23 (!) 189/84  05/31/23 (!) 169/82    BMET    Component Value Date/Time   NA 139 07/22/2023 1538   K 4.4 07/22/2023 1538   CL 100 07/22/2023 1538   CO2 22 07/22/2023 1538   GLUCOSE 100 (H) 07/22/2023 1538   GLUCOSE 143 (H) 07/02/2022 1023   BUN 19 07/22/2023 1538   CREATININE 0.78 07/22/2023 1538   CALCIUM 9.4 07/22/2023 1538   GFRNONAA >60 07/02/2022 1023   GFRAA >60 03/04/2020 1114    Renal function: CrCl cannot be calculated (Patient's most recent lab result is older than the maximum 21 days allowed.).  Clinical ASCVD: No  The 10-year ASCVD risk score (Arnett DK, et al., 2019) is: 4.5%   Values used to calculate the score:     Age: 67 years     Sex: Female     Is Non-Hispanic African American: Yes     Diabetic: No     Tobacco smoker: No     Systolic Blood Pressure: 147 mmHg     Is BP treated: Yes     HDL Cholesterol: 94 mg/dL     Total Cholesterol: 211 mg/dL  Patient is participating in a Managed Medicaid Plan:  Yes  Still taking spiro? F/u adherence Started carvedilol? Restart spiro if BP uncontrolled today - plan for BMP at cardiology appt 4/4? Also needs to restart [place] -test claim? F/u iron supplement?  A/P:  Hypertension with HFrEF diagnosed currently uncontrolled above goal < 130/80 mmHg, but improved from since increasing Entresto.  Patient is due for repeat BMP today after increasing ARNI. For further BP control, will transition patient form metoprolol to carvedilol, with plan to titrate to optimize HFrEF GDMT. She is euvolemic today. She continues to be a good candidate to resume SGLT2i  for HFrEF - will consider restarting once BP is stable. -STOP metoprolol succinate. START carvedilol 6.25 mg PO BID with food.  -Continue Entresto (sacubitril/valsartan) 97-103 mg PO BID -Continue amlodipine 10 mg PO daily, spironolactone 25 mg PO daily, and furosemide 20 mg PO daily -Counseled on lifestyle modifications for blood pressure control including reduced dietary sodium, increased exercise, adequate sleep. -Encouraged patient to check BP at home and bring log of readings to next visit. Counseled on proper use of home BP cuff.  - Advised patient to buy ferrous sulfate 325 mg tablets over the counter at the pharmacy, given severe iron deficiency. Patient reported that she would not be able to go to the pharmacy today, but will buy the supplement [facility].   Atrial fibrillation - Diagnosed in 2023 without proper follow-up. Patient is eligible for anticoagulation with CHA2DS2VASC of 3. Patient successfully restarted Eliquis after last appt and denies abnormal bleeding/bruising. Hgb low due to IDA, and plt WNL. Patient has been scheduled with cardiology for follow-up. - Continue Eliquis (apixaban) 5 mg PO BID  Results reviewed and written information provided.    Written patient instructions provided. Patient verbalized understanding of treatment plan.  Total time in face to face counseling 20 minutes.    Follow-up:  Pharmacist in 1 month - *** Cardiology 09/13/23 PCP 10/09/23  Nils Pyle, PharmD PGY1 Pharmacy Resident  Butch Penny, PharmD, BCACP, CPP Clinical Pharmacist [facility] [phone]

## 2023-08-26 ENCOUNTER — Ambulatory Visit: Payer: Medicaid Other | Admitting: Pharmacist

## 2023-09-13 ENCOUNTER — Ambulatory Visit: Payer: Medicaid Other | Attending: Internal Medicine | Admitting: Cardiovascular Disease

## 2023-09-16 ENCOUNTER — Encounter: Payer: Self-pay | Admitting: Cardiovascular Disease

## 2023-10-08 ENCOUNTER — Ambulatory Visit: Attending: Cardiology | Admitting: Cardiology

## 2023-10-09 ENCOUNTER — Ambulatory Visit: Payer: Self-pay | Admitting: Family Medicine

## 2023-10-24 ENCOUNTER — Ambulatory Visit: Attending: Family Medicine | Admitting: Family Medicine

## 2023-10-24 ENCOUNTER — Encounter: Payer: Self-pay | Admitting: Family Medicine

## 2023-10-24 VITALS — BP 148/79 | HR 70 | Ht 65.0 in | Wt 223.8 lb

## 2023-10-24 DIAGNOSIS — I10 Essential (primary) hypertension: Secondary | ICD-10-CM | POA: Diagnosis not present

## 2023-10-24 DIAGNOSIS — I5022 Chronic systolic (congestive) heart failure: Secondary | ICD-10-CM | POA: Diagnosis not present

## 2023-10-24 DIAGNOSIS — I5042 Chronic combined systolic (congestive) and diastolic (congestive) heart failure: Secondary | ICD-10-CM

## 2023-10-24 DIAGNOSIS — I480 Paroxysmal atrial fibrillation: Secondary | ICD-10-CM

## 2023-10-24 DIAGNOSIS — I482 Chronic atrial fibrillation, unspecified: Secondary | ICD-10-CM

## 2023-10-24 DIAGNOSIS — R7303 Prediabetes: Secondary | ICD-10-CM | POA: Diagnosis not present

## 2023-10-24 DIAGNOSIS — I110 Hypertensive heart disease with heart failure: Secondary | ICD-10-CM

## 2023-10-24 DIAGNOSIS — Z1231 Encounter for screening mammogram for malignant neoplasm of breast: Secondary | ICD-10-CM

## 2023-10-24 DIAGNOSIS — Z1211 Encounter for screening for malignant neoplasm of colon: Secondary | ICD-10-CM

## 2023-10-24 LAB — POCT GLYCOSYLATED HEMOGLOBIN (HGB A1C): HbA1c, POC (prediabetic range): 6 % (ref 5.7–6.4)

## 2023-10-24 MED ORDER — SPIRONOLACTONE 50 MG PO TABS: ORAL_TABLET | ORAL | 1 refills | Status: DC

## 2023-10-24 MED ORDER — DAPAGLIFLOZIN PROPANEDIOL 5 MG PO TABS: 5.0000 mg | ORAL_TABLET | Freq: Every day | ORAL | 1 refills | Status: DC

## 2023-10-24 MED ORDER — FUROSEMIDE 20 MG PO TABS: 20.0000 mg | ORAL_TABLET | Freq: Every day | ORAL | 1 refills | Status: DC

## 2023-10-24 MED ORDER — AMLODIPINE BESYLATE 10 MG PO TABS: 10.0000 mg | ORAL_TABLET | Freq: Every day | ORAL | 1 refills | Status: DC

## 2023-10-24 NOTE — Progress Notes (Signed)
 Subjective:  Patient ID: Susan Hays, female    DOB: 06-Nov-1970  Age: 53 y.o. MRN: [id number]  CC: Medical Management of Chronic Issues (No concerns)     Discussed the use of AI scribe software for clinical note transcription with the patient, who gave verbal consent to proceed.  History of Present Illness Susan Hays is a 53 year old female with hypertension, congestive heart failure (EF 25 to 30%, global hypokinesis, grade 2 diastolic dysfunction from echo 02/2020), diabetes and atrial fibrillation who presents for medication management and follow-up.  Hypertension is currently managed with amlodipine , with improved control since December following medication adherence. Blood pressure was previously high in the hypertensive urgency range but has stabilized with regular medication use.  Congestive heart failure is managed with Entresto , Farxiga , spironolactone , and carvedilol . Ejection fraction was 25-30% in 2021. She experiences no chest pain or shortness of breath. A recent cardiology appointment was missed due to a location change, and rescheduling is pending.  Atrial fibrillation is managed with Eliquis  for anticoagulation, with confirmed adherence.  Prediabetes is noted with a current A1c of 6.0, slightly increased from 5.9.  She works full-time and incorporates daily walking into her routine as she does not drive. No constipation or ankle swelling is reported.    Past Medical History:  Diagnosis Date   Hypertension     Past Surgical History:  Procedure Laterality Date   CESAREAN SECTION      Family History  Problem Relation Age of Onset   Sudden Cardiac Death Neg Hx     Social History   Socioeconomic History   Marital status: Single    Spouse name: Not on file   Number of children: Not on file   Years of education: Not on file   Highest education level: Not on file  Occupational History   Not on file  Tobacco Use   Smoking status: Never   Smokeless  tobacco: Never  Substance and Sexual Activity   Alcohol use: No   Drug use: No   Sexual activity: Not on file  Other Topics Concern   Not on file  Social History Narrative   Not on file   Social Drivers of Health   Financial Resource Strain: Medium Risk (10/24/2023)   Overall Financial Resource Strain (CARDIA)    Difficulty of Paying Living Expenses: Somewhat hard  Food Insecurity: Food Insecurity Present (10/24/2023)   Hunger Vital Sign    Worried About Running Out of Food in the Last Year: Sometimes true    Ran Out of Food in the Last Year: Never true  Transportation Needs: No Transportation Needs (10/24/2023)   PRAPARE - Administrator, Civil Service (Medical): No    Lack of Transportation (Non-Medical): No  Physical Activity: Sufficiently Active (10/24/2023)   Exercise Vital Sign    Days of Exercise per Week: 7 days    Minutes of Exercise per Session: 60 min  Stress: Stress Concern Present (10/24/2023)   [employer] of Occupational Health - Occupational Stress Questionnaire    Feeling of Stress : To some extent  Social Connections: Socially Isolated (10/24/2023)   Social Connection and Isolation Panel [NHANES]    Frequency of Communication with Friends and Family: More than three times a week    Frequency of Social Gatherings with Friends and Family: Twice a week    Attends Religious Services: Never    Database administrator or Organizations: No    Attends Banker  Meetings: Never    Marital Status: Never married    No Known Allergies  Outpatient Medications Prior to Visit  Medication Sig Dispense Refill   apixaban  (ELIQUIS ) 5 MG TABS tablet Take 1 tablet (5 mg total) by mouth 2 (two) times daily. 180 tablet 1   carvedilol  (COREG ) 6.25 MG tablet Take 1 tablet (6.25 mg total) by mouth 2 (two) times daily with a meal. 180 tablet 1   Iron , Ferrous Sulfate , 325 (65 Fe) MG TABS Take 325 mg by mouth daily. 30 tablet 4   sacubitril -valsartan   (ENTRESTO ) 97-103 MG Take 1 tablet by mouth 2 (two) times daily. 180 tablet 3   amLODipine  (NORVASC ) 10 MG tablet Take 1 tablet (10 mg total) by mouth daily. 90 tablet 1   dapagliflozin  propanediol (FARXIGA ) 5 MG TABS tablet Take 1 tablet (5 mg total) by mouth daily before breakfast. 90 tablet 1   furosemide  (LASIX ) 20 MG tablet Take 1 tablet (20 mg total) by mouth daily. 90 tablet 1   spironolactone  (ALDACTONE ) 25 MG tablet TAKE 1 TABLET BY MOUTH EVERYDAY 90 tablet 1   acetaminophen  (TYLENOL ) 500 MG tablet Take 1,000 mg by mouth every 6 (six) hours as needed for moderate pain. (Patient not taking: Reported on 10/24/2023)     No facility-administered medications prior to visit.     ROS Review of Systems  Constitutional:  Negative for activity change and appetite change.  HENT:  Negative for sinus pressure and sore throat.   Respiratory:  Negative for chest tightness, shortness of breath and wheezing.   Cardiovascular:  Negative for chest pain and palpitations.  Gastrointestinal:  Negative for abdominal distention, abdominal pain and constipation.  Genitourinary: Negative.   Musculoskeletal: Negative.   Psychiatric/Behavioral:  Negative for behavioral problems and dysphoric mood.     Objective:  BP (!) 148/79   Pulse 70   Ht 5\' 5"  (1.651 m)   Wt 223 lb 12.8 oz (101.5 kg)   SpO2 98%   BMI 37.24 kg/m      10/24/2023   11:32 AM 10/24/2023   10:59 AM 07/22/2023    3:13 PM  BP/Weight  Systolic BP 148 146 147  Diastolic BP 79 81 80  Wt. (Lbs)  223.8   BMI  37.24 kg/m2       Physical Exam Constitutional:      Appearance: She is well-developed. She is obese.  Cardiovascular:     Rate and Rhythm: Normal rate.     Heart sounds: Normal heart sounds. No murmur heard. Pulmonary:     Effort: Pulmonary effort is normal.     Breath sounds: Normal breath sounds. No wheezing or rales.  Chest:     Chest wall: No tenderness.  Abdominal:     General: Bowel sounds are normal. There is  no distension.     Palpations: Abdomen is soft. There is no mass.     Tenderness: There is no abdominal tenderness.  Musculoskeletal:        General: Normal range of motion.     Right lower leg: No edema.     Left lower leg: No edema.  Neurological:     Mental Status: She is alert and oriented to person, place, and time.  Psychiatric:        Mood and Affect: Mood normal.        Latest Ref Rng & Units 07/22/2023    3:38 PM 07/01/2023   10:39 AM 04/10/2023   11:45 AM  CMP  Glucose 70 - 99 mg/dL 161  096  045   BUN 6 - 24 mg/dL 19  12  18    Creatinine 0.57 - 1.00 mg/dL 4.09  8.11  9.14   Sodium 134 - 144 mmol/L 139  139  138   Potassium 3.5 - 5.2 mmol/L 4.4  4.0  4.9   Chloride 96 - 106 mmol/L 100  100  100   CO2 20 - 29 mmol/L 22  25  25    Calcium 8.7 - 10.2 mg/dL 9.4  9.2  9.6   Total Protein 6.0 - 8.5 g/dL  8.1  7.6   Total Bilirubin 0.0 - 1.2 mg/dL  0.2  <7.8   Alkaline Phos 44 - 121 IU/L  144  124   AST 0 - 40 IU/L  19  25   ALT 0 - 32 IU/L  15  15     Lipid Panel     Component Value Date/Time   CHOL 211 (H) 04/10/2023 1145   TRIG 56 04/10/2023 1145   HDL 94 04/10/2023 1145   CHOLHDL 2.2 04/10/2023 1145   LDLCALC 107 (H) 04/10/2023 1145    CBC    Component Value Date/Time   WBC 7.1 04/10/2023 1145   WBC 5.9 07/02/2022 1023   RBC 4.66 04/10/2023 1145   RBC 4.16 07/02/2022 1023   HGB 10.0 (L) 04/10/2023 1145   HCT 35.3 04/10/2023 1145   PLT 325 04/10/2023 1145   MCV 76 (L) 04/10/2023 1145   MCH 21.5 (L) 04/10/2023 1145   MCH 19.5 (L) 07/02/2022 1023   MCHC 28.3 (L) 04/10/2023 1145   MCHC 28.3 (L) 07/02/2022 1023   RDW 17.1 (H) 04/10/2023 1145   LYMPHSABS 1.3 04/10/2023 1145   MONOABS 0.4 07/02/2022 1023   EOSABS 0.1 04/10/2023 1145   BASOSABS 0.1 04/10/2023 1145    Lab Results  Component Value Date   HGBA1C 6.0 10/24/2023       Assessment & Plan Congestive Heart Failure Chronic heart failure with ejection fraction 25-30%. Emphasized medication  adherence. Cardiology follow-up needed. -Due for repeat echocardiogram - Continue Entresto . - Continue Farxiga . - Continue spironolactone . - Continue carvedilol . - Refer to cardiologist for follow-up appointment.  Atrial Fibrillation Chronic atrial fibrillation managed with anticoagulation. No new symptoms. -Currently in sinus rhythm - Continue Eliquis  for anticoagulation and carvedilol  for rate control  Hypertension Hypertension with slightly elevated readings. Increased spironolactone  to improve control. - Continue amlodipine . - Increase spironolactone  to 50 mg. - Recheck blood pressure. - Will recheck potassium and renal function in 2 weeks given spironolactone  increase  Prediabetes Prediabetes with A1c of 6.0. Discussed diet and lifestyle to prevent diabetes. - Check A1c. - Provide dietary and lifestyle counseling.  General Health Maintenance Due for pneumonia vaccine, Pap smear, and mammogram. Reluctant for Pap smear. - Order mammogram. - Order colonoscopy. - Declines PCV 20      Meds ordered this encounter  Medications   spironolactone  (ALDACTONE ) 50 MG tablet    Sig: TAKE 1 TABLET BY MOUTH EVERYDAY    Dispense:  90 tablet    Refill:  1    Discontinue 25 mg   amLODipine  (NORVASC ) 10 MG tablet    Sig: Take 1 tablet (10 mg total) by mouth daily.    Dispense:  90 tablet    Refill:  1   dapagliflozin  propanediol (FARXIGA ) 5 MG TABS tablet    Sig: Take 1 tablet (5 mg total) by mouth daily before breakfast.    Dispense:  90 tablet    Refill:  1   furosemide  (LASIX ) 20 MG tablet    Sig: Take 1 tablet (20 mg total) by mouth daily.    Dispense:  90 tablet    Refill:  1    Follow-up: Return in about 3 months (around 01/24/2024) for Chronic medical conditions.       Joaquin Mulberry, MD, FAAFP. [facility] and [facility] [place], [place] [phone]   10/24/2023, 12:24 PM

## 2023-10-24 NOTE — Patient Instructions (Signed)
 VISIT SUMMARY:  Today, you came in for a follow-up visit to manage your hypertension, congestive heart failure, atrial fibrillation, and prediabetes. We reviewed your current medications and discussed the importance of adherence to your treatment plan. We also talked about some general health maintenance items that are due.  YOUR PLAN:  -CONGESTIVE HEART FAILURE: Congestive heart failure means your heart doesn't pump blood as well as it should. You should continue taking Entresto , Farxiga , spironolactone , and carvedilol  as prescribed. It's important to reschedule your missed cardiology appointment for further evaluation.  -ATRIAL FIBRILLATION: Atrial fibrillation is an irregular and often rapid heart rate. You should continue taking Eliquis  to prevent blood clots.  -HYPERTENSION: Hypertension is high blood pressure. We have increased your spironolactone  dose to 50 mg to help better control your blood pressure. Please continue taking amlodipine  and monitor your blood pressure regularly.  -PREDIABETES: Prediabetes means your blood sugar levels are higher than normal but not yet high enough to be diagnosed as diabetes. We discussed diet and lifestyle changes to help prevent diabetes. Please follow the dietary and lifestyle advice provided and we will recheck your A1c.  -GENERAL HEALTH MAINTENANCE: You are due for a pneumonia vaccine, Pap smear, and mammogram. We have ordered a mammogram and a colonoscopy for you. We also discussed scheduling your Pap smear and pneumonia vaccine.  INSTRUCTIONS:  Please make sure to reschedule your cardiology appointment as soon as possible. Continue taking your medications as prescribed and monitor your blood pressure regularly. Follow the dietary and lifestyle advice provided to manage your prediabetes. Schedule your mammogram, colonoscopy, Pap smear, and pneumonia vaccine as discussed.

## 2023-12-09 ENCOUNTER — Other Ambulatory Visit: Payer: Self-pay | Admitting: Physician Assistant

## 2023-12-09 DIAGNOSIS — I5042 Chronic combined systolic (congestive) and diastolic (congestive) heart failure: Secondary | ICD-10-CM

## 2023-12-09 DIAGNOSIS — I110 Hypertensive heart disease with heart failure: Secondary | ICD-10-CM

## 2023-12-10 NOTE — Telephone Encounter (Signed)
 Unable to refill per protocol, Rx expired. Discontinued  07/01/23.  Requested Prescriptions  Pending Prescriptions Disp Refills   ENTRESTO  49-51 MG [Pharmacy Med Name: ENTRESTO  49 MG-51 MG TABLET] 180 tablet 1    Sig: TAKE 1 TABLET BY MOUTH TWICE A DAY     Off-Protocol Failed - 12/10/2023  2:57 PM      Failed - Medication not assigned to a protocol, review manually.      Passed - Valid encounter within last 12 months    Recent Outpatient Visits           1 month ago Prediabetes   [facility] Of [place]. [facility] Delbert Clam, MD   4 months ago Chronic combined systolic (congestive) and diastolic (congestive) heart failure [facility])   [facility] - [facility] Dept Of [place]. [facility] Fleeta Morris, Garnette CROME, RPH-CPP   5 months ago Essential hypertension   [facility] - A Dept Of [place]. [facility] Fleeta Morris, [place] L, RPH-CPP   6 months ago Hypertensive heart disease with chronic systolic congestive heart failure [facility])   [facility] [place] [facility] Dept Of [place]. [facility] Fleeta Morris Garnette CROME, RPH-CPP   8 months ago Prediabetes   [facility] Dept Of [place]. [facility] [place], [place], PA-C

## 2023-12-20 ENCOUNTER — Other Ambulatory Visit: Payer: Self-pay | Admitting: Family Medicine

## 2023-12-20 DIAGNOSIS — Z1211 Encounter for screening for malignant neoplasm of colon: Secondary | ICD-10-CM

## 2024-01-02 ENCOUNTER — Other Ambulatory Visit: Payer: Self-pay | Admitting: Physician Assistant

## 2024-01-02 DIAGNOSIS — I5042 Chronic combined systolic (congestive) and diastolic (congestive) heart failure: Secondary | ICD-10-CM

## 2024-01-02 DIAGNOSIS — I110 Hypertensive heart disease with heart failure: Secondary | ICD-10-CM

## 2024-01-03 ENCOUNTER — Other Ambulatory Visit: Payer: Self-pay | Admitting: Family Medicine

## 2024-01-03 DIAGNOSIS — I5042 Chronic combined systolic (congestive) and diastolic (congestive) heart failure: Secondary | ICD-10-CM

## 2024-01-27 ENCOUNTER — Encounter: Payer: Self-pay | Admitting: Family Medicine

## 2024-01-27 ENCOUNTER — Ambulatory Visit: Attending: Family Medicine | Admitting: Family Medicine

## 2024-01-27 VITALS — BP 136/80 | HR 68 | Ht 65.0 in | Wt 222.2 lb

## 2024-01-27 DIAGNOSIS — I5022 Chronic systolic (congestive) heart failure: Secondary | ICD-10-CM | POA: Diagnosis not present

## 2024-01-27 DIAGNOSIS — I480 Paroxysmal atrial fibrillation: Secondary | ICD-10-CM | POA: Diagnosis not present

## 2024-01-27 DIAGNOSIS — R7303 Prediabetes: Secondary | ICD-10-CM | POA: Diagnosis not present

## 2024-01-27 DIAGNOSIS — I110 Hypertensive heart disease with heart failure: Secondary | ICD-10-CM

## 2024-01-27 MED ORDER — SPIRONOLACTONE 50 MG PO TABS: ORAL_TABLET | ORAL | 1 refills | Status: AC

## 2024-01-27 MED ORDER — APIXABAN 5 MG PO TABS: 5.0000 mg | ORAL_TABLET | Freq: Two times a day (BID) | ORAL | 1 refills | Status: AC

## 2024-01-27 MED ORDER — DAPAGLIFLOZIN PROPANEDIOL 5 MG PO TABS: 5.0000 mg | ORAL_TABLET | Freq: Every day | ORAL | 1 refills | Status: AC

## 2024-01-27 MED ORDER — AMLODIPINE BESYLATE 10 MG PO TABS: 10.0000 mg | ORAL_TABLET | Freq: Every day | ORAL | 1 refills | Status: AC

## 2024-01-27 MED ORDER — SACUBITRIL-VALSARTAN 49-51 MG PO TABS: 1.0000 | ORAL_TABLET | Freq: Two times a day (BID) | ORAL | 1 refills | Status: AC

## 2024-01-27 MED ORDER — CARVEDILOL 6.25 MG PO TABS: 6.2500 mg | ORAL_TABLET | Freq: Two times a day (BID) | ORAL | 1 refills | Status: AC

## 2024-01-27 MED ORDER — FUROSEMIDE 20 MG PO TABS: 20.0000 mg | ORAL_TABLET | Freq: Every day | ORAL | 1 refills | Status: AC

## 2024-01-27 NOTE — Progress Notes (Signed)
 Subjective:  Patient ID: Susan Hays, female    DOB: 08-10-1970  Age: 53 y.o. MRN: [id number]  CC: Medical Management of Chronic Issues     Discussed the use of AI scribe software for clinical note transcription with the patient, who gave verbal consent to proceed.  History of Present Illness Susan Hays is a 53 year old female with hypertension, congestive heart failure (EF 25 to 30%, global hypokinesis, grade 2 diastolic dysfunction from echo 02/2020), Prediabetes and atrial fibrillation  who presents for medication management and follow-up.  She has run out of Entresto  and has not taken them today. She is currently taking furosemide  and experiences frequent urination, especially at work. She denies shortness of breath or ankle swelling. Her current medications include amlodipine , Entresto , Farxiga , spironolactone , and furosemide  and she is on Eliquis  for paroxysmal A-fib. She has not followed up with cardiology in a while despite being encouraged at her last visit to do so.  She feels that stress from having a lot on her mind may be contributing to elevated blood pressure. She works full-time in a physically demanding job involving walking, lifting, and pulling.  She has received a Cologuard test kit for colon cancer screening and plans to complete it. She expresses fear of undergoing a mammogram, delaying her scheduling the test.    Past Medical History:  Diagnosis Date   Hypertension     Past Surgical History:  Procedure Laterality Date   CESAREAN SECTION      Family History  Problem Relation Age of Onset   Sudden Cardiac Death Neg Hx     Social History   Socioeconomic History   Marital status: Single    Spouse name: Not on file   Number of children: Not on file   Years of education: Not on file   Highest education level: Not on file  Occupational History   Not on file  Tobacco Use   Smoking status: Never   Smokeless tobacco: Never  Substance and  Sexual Activity   Alcohol use: No   Drug use: No   Sexual activity: Not on file  Other Topics Concern   Not on file  Social History Narrative   Not on file   Social Drivers of Health   Financial Resource Strain: Medium Risk (10/24/2023)   Overall Financial Resource Strain (CARDIA)    Difficulty of Paying Living Expenses: Somewhat hard  Food Insecurity: Food Insecurity Present (10/24/2023)   Hunger Vital Sign    Worried About Running Out of Food in the Last Year: Sometimes true    Ran Out of Food in the Last Year: Never true  Transportation Needs: No Transportation Needs (10/24/2023)   PRAPARE - Administrator, Civil Service (Medical): No    Lack of Transportation (Non-Medical): No  Physical Activity: Sufficiently Active (10/24/2023)   Exercise Vital Sign    Days of Exercise per Week: 7 days    Minutes of Exercise per Session: 60 min  Stress: Stress Concern Present (10/24/2023)   [employer] of Occupational Health - Occupational Stress Questionnaire    Feeling of Stress : To some extent  Social Connections: Socially Isolated (10/24/2023)   Social Connection and Isolation Panel    Frequency of Communication with Friends and Family: More than three times a week    Frequency of Social Gatherings with Friends and Family: Twice a week    Attends Religious Services: Never    Database administrator or Organizations: No  Attends Banker Meetings: Never    Marital Status: Never married    No Known Allergies  Outpatient Medications Prior to Visit  Medication Sig Dispense Refill   acetaminophen  (TYLENOL ) 500 MG tablet Take 1,000 mg by mouth every 6 (six) hours as needed for moderate pain.     Iron , Ferrous Sulfate , 325 (65 Fe) MG TABS Take 325 mg by mouth daily. 30 tablet 4   amLODipine  (NORVASC ) 10 MG tablet Take 1 tablet (10 mg total) by mouth daily. 90 tablet 1   apixaban  (ELIQUIS ) 5 MG TABS tablet Take 1 tablet (5 mg total) by mouth 2 (two) times daily.  180 tablet 1   carvedilol  (COREG ) 6.25 MG tablet Take 1 tablet (6.25 mg total) by mouth 2 (two) times daily with a meal. 180 tablet 1   dapagliflozin  propanediol (FARXIGA ) 5 MG TABS tablet Take 1 tablet (5 mg total) by mouth daily before breakfast. 90 tablet 1   furosemide  (LASIX ) 20 MG tablet Take 1 tablet (20 mg total) by mouth daily. 90 tablet 1   sacubitril -valsartan  (ENTRESTO ) 49-51 MG TAKE 1 TABLET BY MOUTH TWICE A DAY 180 tablet 0   sacubitril -valsartan  (ENTRESTO ) 97-103 MG Take 1 tablet by mouth 2 (two) times daily. 180 tablet 3   spironolactone  (ALDACTONE ) 50 MG tablet TAKE 1 TABLET BY MOUTH EVERYDAY 90 tablet 1   No facility-administered medications prior to visit.     ROS Review of Systems  Constitutional:  Negative for activity change and appetite change.  HENT:  Negative for sinus pressure and sore throat.   Respiratory:  Negative for chest tightness, shortness of breath and wheezing.   Cardiovascular:  Negative for chest pain and palpitations.  Gastrointestinal:  Negative for abdominal distention, abdominal pain and constipation.  Genitourinary: Negative.   Musculoskeletal: Negative.   Psychiatric/Behavioral:  Negative for behavioral problems and dysphoric mood.     Objective:  BP 136/80   Pulse 68   Ht 5' 5 (1.651 m)   Wt 222 lb 3.2 oz (100.8 kg)   SpO2 97%   BMI 36.98 kg/m      01/27/2024   11:45 AM 01/27/2024   11:17 AM 10/24/2023   11:32 AM  BP/Weight  Systolic BP 136 161 148  Diastolic BP 80 83 79  Wt. (Lbs)  222.2   BMI  36.98 kg/m2       Physical Exam Constitutional:      Appearance: She is well-developed.  Cardiovascular:     Rate and Rhythm: Normal rate.     Heart sounds: Normal heart sounds. No murmur heard. Pulmonary:     Effort: Pulmonary effort is normal.     Breath sounds: Normal breath sounds. No wheezing or rales.  Chest:     Chest wall: No tenderness.  Abdominal:     General: Bowel sounds are normal. There is no distension.      Palpations: Abdomen is soft. There is no mass.     Tenderness: There is no abdominal tenderness.  Musculoskeletal:        General: Normal range of motion.     Right lower leg: No edema.     Left lower leg: No edema.  Neurological:     Mental Status: She is alert and oriented to person, place, and time.  Psychiatric:        Mood and Affect: Mood normal.        Latest Ref Rng & Units 07/22/2023    3:38 PM 07/01/2023  10:39 AM 04/10/2023   11:45 AM  CMP  Glucose 70 - 99 mg/dL 899  867  891   BUN 6 - 24 mg/dL 19  12  18    Creatinine 0.57 - 1.00 mg/dL 9.21  9.09  9.18   Sodium 134 - 144 mmol/L 139  139  138   Potassium 3.5 - 5.2 mmol/L 4.4  4.0  4.9   Chloride 96 - 106 mmol/L 100  100  100   CO2 20 - 29 mmol/L 22  25  25    Calcium 8.7 - 10.2 mg/dL 9.4  9.2  9.6   Total Protein 6.0 - 8.5 g/dL  8.1  7.6   Total Bilirubin 0.0 - 1.2 mg/dL  0.2  <9.7   Alkaline Phos 44 - 121 IU/L  144  124   AST 0 - 40 IU/L  19  25   ALT 0 - 32 IU/L  15  15     Lipid Panel     Component Value Date/Time   CHOL 211 (H) 04/10/2023 1145   TRIG 56 04/10/2023 1145   HDL 94 04/10/2023 1145   CHOLHDL 2.2 04/10/2023 1145   LDLCALC 107 (H) 04/10/2023 1145    CBC    Component Value Date/Time   WBC 7.1 04/10/2023 1145   WBC 5.9 07/02/2022 1023   RBC 4.66 04/10/2023 1145   RBC 4.16 07/02/2022 1023   HGB 10.0 (L) 04/10/2023 1145   HCT 35.3 04/10/2023 1145   PLT 325 04/10/2023 1145   MCV 76 (L) 04/10/2023 1145   MCH 21.5 (L) 04/10/2023 1145   MCH 19.5 (L) 07/02/2022 1023   MCHC 28.3 (L) 04/10/2023 1145   MCHC 28.3 (L) 07/02/2022 1023   RDW 17.1 (H) 04/10/2023 1145   LYMPHSABS 1.3 04/10/2023 1145   MONOABS 0.4 07/02/2022 1023   EOSABS 0.1 04/10/2023 1145   BASOSABS 0.1 04/10/2023 1145    Lab Results  Component Value Date   HGBA1C 6.0 10/24/2023       Assessment & Plan 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 Medication adherence reported, but ran out of Entresto   potentially elevating blood pressure. Effective diuresis with furosemide . - Refill Entresto  and other cardiac prescriptions. - Ensure cardiology referral for follow-up. -Initial hypertension likely due to running out of Entresto . Blood pressure 136/80, within normal range. - Continue current antihypertensive medications.   Paroxysmal A-fib - Currently in sinus rhythm - Continue Eliquis    Prediabetes -Labs reveal prediabetes with an A1c of 6.0.  An A1c of 6.5 is supportive of a diagnosis of type 2 diabetes mellitus.  Working on a low carbohydrate diet, exercise, weight loss is recommended in order to prevent progression to type 2 diabetes mellitus.   Meds ordered this encounter  Medications   amLODipine  (NORVASC ) 10 MG tablet    Sig: Take 1 tablet (10 mg total) by mouth daily.    Dispense:  90 tablet    Refill:  1   apixaban  (ELIQUIS ) 5 MG TABS tablet    Sig: Take 1 tablet (5 mg total) by mouth 2 (two) times daily.    Dispense:  180 tablet    Refill:  1   carvedilol  (COREG ) 6.25 MG tablet    Sig: Take 1 tablet (6.25 mg total) by mouth 2 (two) times daily with a meal.    Dispense:  180 tablet  Refill:  1   dapagliflozin  propanediol (FARXIGA ) 5 MG TABS tablet    Sig: Take 1 tablet (5 mg total) by mouth daily before breakfast.    Dispense:  90 tablet    Refill:  1   furosemide  (LASIX ) 20 MG tablet    Sig: Take 1 tablet (20 mg total) by mouth daily.    Dispense:  90 tablet    Refill:  1   sacubitril -valsartan  (ENTRESTO ) 49-51 MG    Sig: Take 1 tablet by mouth 2 (two) times daily.    Dispense:  180 tablet    Refill:  1   spironolactone  (ALDACTONE ) 50 MG tablet    Sig: TAKE 1 TABLET BY MOUTH EVERYDAY    Dispense:  90 tablet    Refill:  1    Discontinue 25 mg    Follow-up: Return in about 6 months (around 07/29/2024) for Chronic medical conditions.        Corrina Sabin, MD, FAAFP. [facility] and Wellness [place], [place] [phone]   01/27/2024, 11:55 AM

## 2024-01-27 NOTE — Patient Instructions (Signed)
 VISIT SUMMARY:  Today, we discussed your ongoing management of congestive heart failure and hypertension. You mentioned running out of Entresto  and Aquetis, and we addressed your concerns about frequent urination and stress-related blood pressure elevation. We also talked about your upcoming colon cancer screening and your hesitation about scheduling a mammogram.  YOUR PLAN:  -CHRONIC COMBINED SYSTOLIC AND DIASTOLIC (CONGESTIVE) HEART FAILURE: Congestive heart failure means your heart doesn't pump blood as well as it should. You reported no symptoms like shortness of breath or swelling, but you ran out of Entresto  and Aquetis, which might affect your blood pressure. We have refilled your prescriptions for Entresto  and Aquetis, and you should continue taking furosemide  as prescribed. We will also ensure you have a follow-up appointment with cardiology.  -ESSENTIAL (PRIMARY) HYPERTENSION: Hypertension means high blood pressure. Your blood pressure today was 136/80, which is within the normal range. Running out of Entresto  might have caused an initial rise in your blood pressure. Please continue taking your current blood pressure medications as prescribed.  INSTRUCTIONS:  Please make sure to pick up your refilled prescriptions for Entresto  and Aquetis and take them as directed. Continue taking your other medications, including furosemide , as prescribed. Schedule a follow-up appointment with cardiology. Complete your Cologuard test for colon cancer screening and consider scheduling your mammogram despite your concerns. If you experience any new symptoms or have any questions, please contact our office.

## 2024-01-28 ENCOUNTER — Ambulatory Visit: Payer: Self-pay | Admitting: Family Medicine

## 2024-01-28 LAB — BASIC METABOLIC PANEL WITH GFR
BUN/Creatinine Ratio: 15 (ref 9–23)
BUN: 13 mg/dL (ref 6–24)
CO2: 23 mmol/L (ref 20–29)
Calcium: 9.1 mg/dL (ref 8.7–10.2)
Chloride: 100 mmol/L (ref 96–106)
Creatinine, Ser: 0.87 mg/dL (ref 0.57–1.00)
Glucose: 117 mg/dL — ABNORMAL HIGH (ref 70–99)
Potassium: 4.6 mmol/L (ref 3.5–5.2)
Sodium: 138 mmol/L (ref 134–144)
eGFR: 79 mL/min/1.73 (ref >59)

## 2024-02-07 LAB — COLOGUARD: COLOGUARD: NEGATIVE

## 2024-02-11 ENCOUNTER — Ambulatory Visit: Payer: Self-pay | Admitting: Family Medicine

## 2024-07-29 ENCOUNTER — Ambulatory Visit: Admitting: Family Medicine
